# Patient Record
Sex: Female | Born: 1945
Health system: Southern US, Community
[De-identification: ages and names within clinical notes are randomized; demographics above are authoritative.]

---

## 2007-11-26 ENCOUNTER — Ambulatory Visit (HOSPITAL_BASED_OUTPATIENT_CLINIC_OR_DEPARTMENT_OTHER): Admission: RE | Admit: 2007-11-26 | Discharge: 2007-11-26 | Payer: Self-pay | Admitting: Orthopedic Surgery

## 2008-02-04 ENCOUNTER — Emergency Department (HOSPITAL_COMMUNITY): Admission: EM | Admit: 2008-02-04 | Discharge: 2008-02-04 | Payer: Self-pay | Admitting: Emergency Medicine

## 2008-05-27 ENCOUNTER — Ambulatory Visit: Payer: Self-pay | Admitting: Vascular Surgery

## 2008-06-19 ENCOUNTER — Ambulatory Visit: Payer: Self-pay | Admitting: Vascular Surgery

## 2010-08-10 ENCOUNTER — Encounter (INDEPENDENT_AMBULATORY_CARE_PROVIDER_SITE_OTHER): Payer: Self-pay | Admitting: *Deleted

## 2010-08-10 ENCOUNTER — Encounter: Admission: RE | Admit: 2010-08-10 | Discharge: 2010-08-10 | Payer: Self-pay

## 2010-08-16 ENCOUNTER — Encounter (INDEPENDENT_AMBULATORY_CARE_PROVIDER_SITE_OTHER): Payer: Self-pay | Admitting: *Deleted

## 2010-09-26 ENCOUNTER — Ambulatory Visit: Payer: Self-pay | Admitting: Gastroenterology

## 2010-09-26 DIAGNOSIS — K7689 Other specified diseases of liver: Secondary | ICD-10-CM

## 2010-10-10 ENCOUNTER — Encounter: Payer: Self-pay | Admitting: Gastroenterology

## 2010-11-14 ENCOUNTER — Encounter (INDEPENDENT_AMBULATORY_CARE_PROVIDER_SITE_OTHER): Payer: Self-pay | Admitting: General Surgery

## 2010-11-14 ENCOUNTER — Inpatient Hospital Stay (HOSPITAL_COMMUNITY)
Admission: RE | Admit: 2010-11-14 | Discharge: 2010-11-20 | Payer: Self-pay | Source: Home / Self Care | Attending: General Surgery | Admitting: General Surgery

## 2010-11-14 LAB — COMPREHENSIVE METABOLIC PANEL
ALT: 14 U/L (ref 0–35)
AST: 22 U/L (ref 0–37)
Albumin: 3.8 g/dL (ref 3.5–5.2)
Alkaline Phosphatase: 99 U/L (ref 39–117)
BUN: 11 mg/dL (ref 6–23)
CO2: 25 mEq/L (ref 19–32)
Calcium: 9.4 mg/dL (ref 8.4–10.5)
Chloride: 108 mEq/L (ref 96–112)
Creatinine, Ser: 0.66 mg/dL (ref 0.4–1.2)
GFR calc Af Amer: >60 mL/min (ref >60)
GFR calc non Af Amer: >60 mL/min (ref >60)
Glucose, Bld: 92 mg/dL (ref 70–99)
Potassium: 4.6 mEq/L (ref 3.5–5.1)
Sodium: 140 mEq/L (ref 135–145)
Total Bilirubin: 0.4 mg/dL (ref 0.3–1.2)
Total Protein: 6.7 g/dL (ref 6.0–8.3)

## 2010-11-14 LAB — URINALYSIS, ROUTINE W REFLEX MICROSCOPIC
Bilirubin Urine: NEGATIVE
Hgb urine dipstick: NEGATIVE
Ketones, ur: NEGATIVE mg/dL
Nitrite: NEGATIVE
Protein, ur: NEGATIVE mg/dL
Specific Gravity, Urine: 1.023 (ref 1.005–1.030)
Urine Glucose, Fasting: NEGATIVE mg/dL
Urobilinogen, UA: 0.2 mg/dL (ref 0.0–1.0)
pH: 5.5 (ref 5.0–8.0)

## 2010-11-14 LAB — DIFFERENTIAL
Basophils Absolute: 0 10*3/uL (ref 0.0–0.1)
Basophils Relative: 0 % (ref 0–1)
Eosinophils Absolute: 0.2 10*3/uL (ref 0.0–0.7)
Eosinophils Relative: 2 % (ref 0–5)
Lymphocytes Relative: 22 % (ref 12–46)
Lymphs Abs: 3.1 10*3/uL (ref 0.7–4.0)
Monocytes Absolute: 0.9 10*3/uL (ref 0.1–1.0)
Monocytes Relative: 7 % (ref 3–12)
Neutro Abs: 9.6 10*3/uL — ABNORMAL HIGH (ref 1.7–7.7)
Neutrophils Relative %: 69 % (ref 43–77)

## 2010-11-14 LAB — SURGICAL PCR SCREEN
MRSA, PCR: NEGATIVE
Staphylococcus aureus: NEGATIVE

## 2010-11-14 LAB — CBC
HCT: 41.9 % (ref 36.0–46.0)
Hemoglobin: 14 g/dL (ref 12.0–15.0)
MCH: 31.2 pg (ref 26.0–34.0)
MCHC: 33.4 g/dL (ref 30.0–36.0)
MCV: 93.3 fL (ref 78.0–100.0)
Platelets: 341 10*3/uL (ref 150–400)
RBC: 4.49 MIL/uL (ref 3.87–5.11)
RDW: 12.9 % (ref 11.5–15.5)
WBC: 13.8 10*3/uL — ABNORMAL HIGH (ref 4.0–10.5)

## 2010-11-14 LAB — APTT: aPTT: 31 seconds (ref 24–37)

## 2010-11-14 LAB — PROTIME-INR
INR: 0.97 (ref 0.00–1.49)
Prothrombin Time: 13.1 seconds (ref 11.6–15.2)

## 2010-11-14 LAB — ABO/RH: ABO/RH(D): O POS

## 2010-11-16 LAB — POCT I-STAT 3, ART BLOOD GAS (G3+)
Acid-Base Excess: 2 mmol/L (ref 0.0–2.0)
Acid-Base Excess: 4 mmol/L — ABNORMAL HIGH (ref 0.0–2.0)
Acid-Base Excess: 4 mmol/L — ABNORMAL HIGH (ref 0.0–2.0)
Bicarbonate: 27.2 mEq/L — ABNORMAL HIGH (ref 20.0–24.0)
Bicarbonate: 29.4 mEq/L — ABNORMAL HIGH (ref 20.0–24.0)
Bicarbonate: 29.9 mEq/L — ABNORMAL HIGH (ref 20.0–24.0)
Bicarbonate: 30.5 mEq/L — ABNORMAL HIGH (ref 20.0–24.0)
O2 Saturation: 91 %
O2 Saturation: 98 %
O2 Saturation: 95 %
O2 Saturation: 88 %
Patient temperature: 97.8
Patient temperature: 97.8
Patient temperature: 98.4
Patient temperature: 99
TCO2: 29 mmol/L (ref 0–100)
TCO2: 31 mmol/L (ref 0–100)
TCO2: 31 mmol/L (ref 0–100)
TCO2: 32 mmol/L (ref 0–100)
pCO2 arterial: 52.7 mmHg — ABNORMAL HIGH (ref 35.0–45.0)
pCO2 arterial: 54.6 mmHg — ABNORMAL HIGH (ref 35.0–45.0)
pCO2 arterial: 47.5 mmHg — ABNORMAL HIGH (ref 35.0–45.0)
pCO2 arterial: 52.8 mmHg — ABNORMAL HIGH (ref 35.0–45.0)
pH, Arterial: 7.318 — ABNORMAL LOW (ref 7.350–7.400)
pH, Arterial: 7.336 — ABNORMAL LOW (ref 7.350–7.400)
pH, Arterial: 7.406 — ABNORMAL HIGH (ref 7.350–7.400)
pH, Arterial: 7.371 (ref 7.350–7.400)
pO2, Arterial: 65 mmHg — ABNORMAL LOW (ref 80.0–100.0)
pO2, Arterial: 106 mmHg — ABNORMAL HIGH (ref 80.0–100.0)
pO2, Arterial: 74 mmHg — ABNORMAL LOW (ref 80.0–100.0)
pO2, Arterial: 58 mmHg — ABNORMAL LOW (ref 80.0–100.0)

## 2010-11-16 LAB — CBC
HCT: 43.1 % (ref 36.0–46.0)
HCT: 41.8 % (ref 36.0–46.0)
HCT: 41.3 % (ref 36.0–46.0)
HCT: 40.8 % (ref 36.0–46.0)
Hemoglobin: 14.8 g/dL (ref 12.0–15.0)
Hemoglobin: 14.1 g/dL (ref 12.0–15.0)
Hemoglobin: 13.8 g/dL (ref 12.0–15.0)
Hemoglobin: 13.6 g/dL (ref 12.0–15.0)
MCH: 30.1 pg (ref 26.0–34.0)
MCH: 29.4 pg (ref 26.0–34.0)
MCH: 29.1 pg (ref 26.0–34.0)
MCH: 29.9 pg (ref 26.0–34.0)
MCHC: 34.3 g/dL (ref 30.0–36.0)
MCHC: 33.7 g/dL (ref 30.0–36.0)
MCHC: 33.4 g/dL (ref 30.0–36.0)
MCHC: 33.3 g/dL (ref 30.0–36.0)
MCV: 87.6 fL (ref 78.0–100.0)
MCV: 87.1 fL (ref 78.0–100.0)
MCV: 87.1 fL (ref 78.0–100.0)
MCV: 89.7 fL (ref 78.0–100.0)
Platelets: 59 10*3/uL — ABNORMAL LOW (ref 150–400)
Platelets: 65 10*3/uL — ABNORMAL LOW (ref 150–400)
Platelets: 107 10*3/uL — ABNORMAL LOW (ref 150–400)
Platelets: 100 10*3/uL — ABNORMAL LOW (ref 150–400)
RBC: 4.92 MIL/uL (ref 3.87–5.11)
RBC: 4.8 MIL/uL (ref 3.87–5.11)
RBC: 4.74 MIL/uL (ref 3.87–5.11)
RBC: 4.55 MIL/uL (ref 3.87–5.11)
RDW: 15.1 % (ref 11.5–15.5)
RDW: 15.1 % (ref 11.5–15.5)
RDW: 15.2 % (ref 11.5–15.5)
RDW: 15.5 % (ref 11.5–15.5)
WBC: 17.5 10*3/uL — ABNORMAL HIGH (ref 4.0–10.5)
WBC: 11.9 10*3/uL — ABNORMAL HIGH (ref 4.0–10.5)
WBC: 12.7 10*3/uL — ABNORMAL HIGH (ref 4.0–10.5)
WBC: 19.4 10*3/uL — ABNORMAL HIGH (ref 4.0–10.5)

## 2010-11-16 LAB — PREPARE FRESH FROZEN PLASMA
Unit division: 0
Unit division: 0
Unit division: 0
Unit division: 0

## 2010-11-16 LAB — GLUCOSE, CAPILLARY
Glucose-Capillary: 135 mg/dL — ABNORMAL HIGH (ref 70–99)
Glucose-Capillary: 145 mg/dL — ABNORMAL HIGH (ref 70–99)
Glucose-Capillary: 114 mg/dL — ABNORMAL HIGH (ref 70–99)
Glucose-Capillary: 118 mg/dL — ABNORMAL HIGH (ref 70–99)
Glucose-Capillary: 126 mg/dL — ABNORMAL HIGH (ref 70–99)
Glucose-Capillary: 126 mg/dL — ABNORMAL HIGH (ref 70–99)
Glucose-Capillary: 129 mg/dL — ABNORMAL HIGH (ref 70–99)
Glucose-Capillary: 138 mg/dL — ABNORMAL HIGH (ref 70–99)

## 2010-11-16 LAB — APTT
aPTT: 30 seconds (ref 24–37)
aPTT: 28 seconds (ref 24–37)

## 2010-11-16 LAB — BASIC METABOLIC PANEL
BUN: 10 mg/dL (ref 6–23)
CO2: 27 mEq/L (ref 19–32)
Calcium: 8 mg/dL — ABNORMAL LOW (ref 8.4–10.5)
Chloride: 106 mEq/L (ref 96–112)
Creatinine, Ser: 0.81 mg/dL (ref 0.4–1.2)
GFR calc Af Amer: >60 mL/min (ref >60)
GFR calc non Af Amer: >60 mL/min (ref >60)
Glucose, Bld: 230 mg/dL — ABNORMAL HIGH (ref 70–99)
Potassium: 3.8 mEq/L (ref 3.5–5.1)
Sodium: 141 mEq/L (ref 135–145)

## 2010-11-16 LAB — COMPREHENSIVE METABOLIC PANEL
ALT: 334 U/L — ABNORMAL HIGH (ref 0–35)
AST: 329 U/L — ABNORMAL HIGH (ref 0–37)
Albumin: 2.2 g/dL — ABNORMAL LOW (ref 3.5–5.2)
Alkaline Phosphatase: 42 U/L (ref 39–117)
BUN: 6 mg/dL (ref 6–23)
CO2: 29 mEq/L (ref 19–32)
Calcium: 7.9 mg/dL — ABNORMAL LOW (ref 8.4–10.5)
Chloride: 106 mEq/L (ref 96–112)
Creatinine, Ser: 0.69 mg/dL (ref 0.4–1.2)
GFR calc Af Amer: >60 mL/min (ref >60)
GFR calc non Af Amer: >60 mL/min (ref >60)
Glucose, Bld: 145 mg/dL — ABNORMAL HIGH (ref 70–99)
Potassium: 4.1 mEq/L (ref 3.5–5.1)
Sodium: 140 mEq/L (ref 135–145)
Total Bilirubin: 0.7 mg/dL (ref 0.3–1.2)
Total Protein: 4 g/dL — ABNORMAL LOW (ref 6.0–8.3)

## 2010-11-16 LAB — DIC (DISSEMINATED INTRAVASCULAR COAGULATION)PANEL
D-Dimer, Quant: <0.22 ug/mL-FEU (ref 0.00–0.48)
Fibrinogen: 160 mg/dL — ABNORMAL LOW (ref 204–475)
Platelets: 53 10*3/uL — ABNORMAL LOW (ref 150–400)
Prothrombin Time: 21.3 seconds — ABNORMAL HIGH (ref 11.6–15.2)
Smear Review: NONE SEEN
aPTT: 47 seconds — ABNORMAL HIGH (ref 24–37)

## 2010-11-16 LAB — PROTIME-INR
INR: 1.41 (ref 0.00–1.49)
INR: 1.26 (ref 0.00–1.49)
INR: 1.19 (ref 0.00–1.49)
INR: 1.14 (ref 0.00–1.49)
Prothrombin Time: 17.5 seconds — ABNORMAL HIGH (ref 11.6–15.2)
Prothrombin Time: 16 seconds — ABNORMAL HIGH (ref 11.6–15.2)
Prothrombin Time: 15.3 seconds — ABNORMAL HIGH (ref 11.6–15.2)
Prothrombin Time: 14.8 seconds (ref 11.6–15.2)

## 2010-11-16 LAB — PHOSPHORUS: Phosphorus: 4.6 mg/dL (ref 2.3–4.6)

## 2010-11-16 LAB — PREPARE PLATELETS: Unit division: 0

## 2010-11-16 LAB — MAGNESIUM: Magnesium: 1.2 mg/dL — ABNORMAL LOW (ref 1.5–2.5)

## 2010-11-16 LAB — HEMOGLOBIN AND HEMATOCRIT, BLOOD
HCT: 20.7 % — ABNORMAL LOW (ref 36.0–46.0)
Hemoglobin: 6.8 g/dL — CL (ref 12.0–15.0)

## 2010-11-16 LAB — CARDIAC PANEL(CRET KIN+CKTOT+MB+TROPI)
CK, MB: 4.2 ng/mL — ABNORMAL HIGH (ref 0.3–4.0)
Relative Index: 2.7 — ABNORMAL HIGH (ref 0.0–2.5)
Total CK: 158 U/L (ref 7–177)
Troponin I: 0.06 ng/mL (ref 0.00–0.06)

## 2010-11-16 LAB — DIC (DISSEMINATED INTRAVASCULAR COAGULATION) PANEL: INR: 1.83 — ABNORMAL HIGH (ref 0.00–1.49)

## 2010-11-16 LAB — FIBRINOGEN: Fibrinogen: 232 mg/dL (ref 204–475)

## 2010-11-21 LAB — CBC
HCT: 40.7 % (ref 36.0–46.0)
Hemoglobin: 12.9 g/dL (ref 12.0–15.0)
MCH: 29.4 pg (ref 26.0–34.0)
MCHC: 31.7 g/dL (ref 30.0–36.0)
MCV: 92.7 fL (ref 78.0–100.0)
Platelets: 105 10*3/uL — ABNORMAL LOW (ref 150–400)
Platelets: 116 10*3/uL — ABNORMAL LOW (ref 150–400)
RBC: 4.39 MIL/uL (ref 3.87–5.11)
RBC: 4.08 MIL/uL (ref 3.87–5.11)
RDW: 15.5 % (ref 11.5–15.5)
WBC: 17.6 10*3/uL — ABNORMAL HIGH (ref 4.0–10.5)
WBC: 15.5 10*3/uL — ABNORMAL HIGH (ref 4.0–10.5)

## 2010-11-21 LAB — TYPE AND SCREEN
ABO/RH(D): O POS
Antibody Screen: NEGATIVE
Unit division: 0
Unit division: 0
Unit division: 0
Unit division: 0
Unit division: 0
Unit division: 0
Unit division: 0
Unit division: 0
Unit division: 0
Unit division: 0
Unit division: 0
Unit division: 0
Unit division: 0
Unit division: 0
Unit division: 0
Unit division: 0
Unit division: 0
Unit division: 0
Unit division: 0
Unit division: 0
Unit division: 0
Unit division: 0
Unit division: 0
Unit division: 0
Unit division: 0
Unit division: 0
Unit division: 0
Unit division: 0

## 2010-11-21 LAB — GLUCOSE, CAPILLARY
Glucose-Capillary: 120 mg/dL — ABNORMAL HIGH (ref 70–99)
Glucose-Capillary: 114 mg/dL — ABNORMAL HIGH (ref 70–99)
Glucose-Capillary: 120 mg/dL — ABNORMAL HIGH (ref 70–99)
Glucose-Capillary: 110 mg/dL — ABNORMAL HIGH (ref 70–99)

## 2010-11-21 LAB — POCT I-STAT 7, (LYTES, BLD GAS, ICA,H+H)
Bicarbonate: 17.7 mEq/L — ABNORMAL LOW (ref 20.0–24.0)
Calcium, Ion: 0.58 mmol/L — CL (ref 1.12–1.32)
Hemoglobin: 5.8 g/dL — CL (ref 12.0–15.0)
Hemoglobin: 8.5 g/dL — ABNORMAL LOW (ref 12.0–15.0)
O2 Saturation: 100 %
Patient temperature: 35.4
Potassium: 4.1 mEq/L (ref 3.5–5.1)
Sodium: 140 mEq/L (ref 135–145)
TCO2: 19 mmol/L (ref 0–100)
TCO2: 33 mmol/L (ref 0–100)
pCO2 arterial: 48.4 mmHg — ABNORMAL HIGH (ref 35.0–45.0)
pH, Arterial: 7.147 — CL (ref 7.350–7.400)
pH, Arterial: 7.414 — ABNORMAL HIGH (ref 7.350–7.400)

## 2010-11-21 LAB — COMPREHENSIVE METABOLIC PANEL
ALT: 231 U/L — ABNORMAL HIGH (ref 0–35)
AST: 154 U/L — ABNORMAL HIGH (ref 0–37)
Albumin: 2.3 g/dL — ABNORMAL LOW (ref 3.5–5.2)
Alkaline Phosphatase: 50 U/L (ref 39–117)
BUN: 6 mg/dL (ref 6–23)
CO2: 33 mEq/L — ABNORMAL HIGH (ref 19–32)
Calcium: 7.6 mg/dL — ABNORMAL LOW (ref 8.4–10.5)
Chloride: 102 mEq/L (ref 96–112)
Creatinine, Ser: 0.67 mg/dL (ref 0.4–1.2)
GFR calc Af Amer: >60 mL/min (ref >60)
GFR calc non Af Amer: >60 mL/min (ref >60)
Glucose, Bld: 120 mg/dL — ABNORMAL HIGH (ref 70–99)
Potassium: 3.8 mEq/L (ref 3.5–5.1)
Sodium: 140 mEq/L (ref 135–145)
Total Bilirubin: 0.5 mg/dL (ref 0.3–1.2)
Total Protein: 4.6 g/dL — ABNORMAL LOW (ref 6.0–8.3)

## 2010-11-21 LAB — BODY FLUID CULTURE
Culture: NO GROWTH
Gram Stain: NONE SEEN

## 2010-11-21 LAB — APTT: aPTT: 30 seconds (ref 24–37)

## 2010-11-21 LAB — PROTIME-INR
INR: 1.1 (ref 0.00–1.49)
Prothrombin Time: 14.4 seconds (ref 11.6–15.2)

## 2010-11-21 LAB — BASIC METABOLIC PANEL
Chloride: 99 mEq/L (ref 96–112)
GFR calc Af Amer: >60 mL/min (ref >60)
Potassium: 3.7 mEq/L (ref 3.5–5.1)

## 2010-11-21 LAB — MAGNESIUM: Magnesium: 2.1 mg/dL (ref 1.5–2.5)

## 2010-11-22 LAB — CBC
Hemoglobin: 11.7 g/dL — ABNORMAL LOW (ref 12.0–15.0)
MCH: 29.5 pg (ref 26.0–34.0)
MCV: 91.4 fL (ref 78.0–100.0)
RBC: 3.97 MIL/uL (ref 3.87–5.11)

## 2010-11-22 LAB — ANAEROBIC CULTURE: Gram Stain: NONE SEEN

## 2010-11-24 ENCOUNTER — Inpatient Hospital Stay (HOSPITAL_COMMUNITY)
Admission: EM | Admit: 2010-11-24 | Discharge: 2010-11-27 | Payer: Self-pay | Source: Home / Self Care | Attending: Internal Medicine | Admitting: Internal Medicine

## 2010-11-24 LAB — POCT I-STAT, CHEM 8
BUN: 26 mg/dL — ABNORMAL HIGH (ref 6–23)
Creatinine, Ser: 1.2 mg/dL (ref 0.4–1.2)
Sodium: 140 mEq/L (ref 135–145)
TCO2: 21 mmol/L (ref 0–100)

## 2010-11-24 LAB — DIFFERENTIAL
Basophils Relative: 0 % (ref 0–1)
Eosinophils Absolute: 0 10*3/uL (ref 0.0–0.7)
Eosinophils Relative: 0 % (ref 0–5)
Lymphocytes Relative: 12 % (ref 12–46)
Neutro Abs: 13.1 10*3/uL — ABNORMAL HIGH (ref 1.7–7.7)

## 2010-11-24 LAB — URINALYSIS, ROUTINE W REFLEX MICROSCOPIC
Protein, ur: NEGATIVE mg/dL
Urobilinogen, UA: 0.2 mg/dL (ref 0.0–1.0)

## 2010-11-24 LAB — HEPATIC FUNCTION PANEL
ALT: 24 U/L (ref 0–35)
Bilirubin, Direct: 0.1 mg/dL (ref 0.0–0.3)
Indirect Bilirubin: 0.9 mg/dL (ref 0.3–0.9)
Total Bilirubin: 1 mg/dL (ref 0.3–1.2)

## 2010-11-24 LAB — COMPREHENSIVE METABOLIC PANEL
Alkaline Phosphatase: 67 U/L (ref 39–117)
BUN: 23 mg/dL (ref 6–23)
CO2: 22 mEq/L (ref 19–32)
Chloride: 107 mEq/L (ref 96–112)
Creatinine, Ser: 1.22 mg/dL — ABNORMAL HIGH (ref 0.4–1.2)
GFR calc non Af Amer: 45 mL/min — ABNORMAL LOW (ref >60)
Glucose, Bld: 125 mg/dL — ABNORMAL HIGH (ref 70–99)
Total Bilirubin: 0.9 mg/dL (ref 0.3–1.2)

## 2010-11-24 LAB — CBC
HCT: 39.7 % (ref 36.0–46.0)
Hemoglobin: 13.2 g/dL (ref 12.0–15.0)
MCH: 29.8 pg (ref 26.0–34.0)
MCV: 89.6 fL (ref 78.0–100.0)
RBC: 4.43 MIL/uL (ref 3.87–5.11)

## 2010-11-24 LAB — LACTIC ACID, PLASMA: Lactic Acid, Venous: 1.1 mmol/L (ref 0.5–2.2)

## 2010-11-25 LAB — COMPREHENSIVE METABOLIC PANEL
AST: 22 U/L (ref 0–37)
Albumin: 2.7 g/dL — ABNORMAL LOW (ref 3.5–5.2)
BUN: 21 mg/dL (ref 6–23)
Calcium: 8.6 mg/dL (ref 8.4–10.5)
Creatinine, Ser: 0.89 mg/dL (ref 0.4–1.2)
GFR calc Af Amer: >60 mL/min (ref >60)
Total Bilirubin: 0.7 mg/dL (ref 0.3–1.2)
Total Protein: 6.2 g/dL (ref 6.0–8.3)

## 2010-11-25 LAB — CBC
HCT: 42.8 % (ref 36.0–46.0)
Hemoglobin: 14.5 g/dL (ref 12.0–15.0)
MCH: 30.5 pg (ref 26.0–34.0)
MCV: 89.9 fL (ref 78.0–100.0)
RBC: 4.76 MIL/uL (ref 3.87–5.11)

## 2010-11-25 LAB — OVA AND PARASITE EXAMINATION

## 2010-11-25 LAB — CLOSTRIDIUM DIFFICILE BY PCR: Toxigenic C. Difficile by PCR: NEGATIVE

## 2010-11-26 LAB — COMPREHENSIVE METABOLIC PANEL
ALT: 18 U/L (ref 0–35)
Albumin: 2.5 g/dL — ABNORMAL LOW (ref 3.5–5.2)
Alkaline Phosphatase: 55 U/L (ref 39–117)
Potassium: 3.9 mEq/L (ref 3.5–5.1)
Sodium: 142 mEq/L (ref 135–145)
Total Protein: 5.6 g/dL — ABNORMAL LOW (ref 6.0–8.3)

## 2010-11-26 LAB — CBC
HCT: 39.7 % (ref 36.0–46.0)
Platelets: 488 10*3/uL — ABNORMAL HIGH (ref 150–400)
RBC: 4.35 MIL/uL (ref 3.87–5.11)
RDW: 14.3 % (ref 11.5–15.5)
WBC: 14.5 10*3/uL — ABNORMAL HIGH (ref 4.0–10.5)

## 2010-11-26 LAB — DIFFERENTIAL
Eosinophils Absolute: 0.1 10*3/uL (ref 0.0–0.7)
Eosinophils Relative: 1 % (ref 0–5)
Lymphocytes Relative: 20 % (ref 12–46)
Monocytes Absolute: 0.7 10*3/uL (ref 0.1–1.0)
Neutrophils Relative %: 74 % (ref 43–77)

## 2010-11-27 LAB — CBC
HCT: 39.8 % (ref 36.0–46.0)
MCV: 90.5 fL (ref 78.0–100.0)
Platelets: 490 10*3/uL — ABNORMAL HIGH (ref 150–400)
RBC: 4.4 MIL/uL (ref 3.87–5.11)
WBC: 12.8 10*3/uL — ABNORMAL HIGH (ref 4.0–10.5)

## 2010-11-27 LAB — DIFFERENTIAL
Eosinophils Absolute: 0.1 10*3/uL (ref 0.0–0.7)
Lymphocytes Relative: 21 % (ref 12–46)
Lymphs Abs: 2.7 10*3/uL (ref 0.7–4.0)
Neutrophils Relative %: 72 % (ref 43–77)

## 2010-11-27 LAB — BASIC METABOLIC PANEL
BUN: 10 mg/dL (ref 6–23)
Chloride: 110 mEq/L (ref 96–112)
Potassium: 4 mEq/L (ref 3.5–5.1)

## 2010-11-27 LAB — MAGNESIUM: Magnesium: 1.7 mg/dL (ref 1.5–2.5)

## 2010-11-27 LAB — PHOSPHORUS: Phosphorus: 3.5 mg/dL (ref 2.3–4.6)

## 2010-11-27 NOTE — Discharge Summary (Signed)
NAME:  Wendy Rios, Wendy Rios NO.:  [id number]  MEDICAL RECORD NO.:  [id number]          PATIENT TYPE:  INP  LOCATION:  1523                         FACILITY:  [facility]  PHYSICIAN:  Rock Nephew, MD       DATE OF BIRTH:  [birth year]  DATE OF ADMISSION:  11/24/2010 DATE OF DISCHARGE:  11/27/2010                        DISCHARGE SUMMARY - REFERRING   PRIMARY CARE PHYSICIAN:  Dr. Katharina Caper in [place] with Nurse Practitioner, Prescilla Sours.  DISCHARGE DIAGNOSES: 1. Diarrhea, etiology not clear, improved, responded to antibiotics. 2. Acute renal failure, resolved secondary to prerenal. 3. Mild hypokalemia, resolved. 4. Recent drainage of liver cyst with hemorrhage. 5. Rash, etiology is not clear, thought to be diarrhea or exotoxin     related, although Clostridium difficile PCR negative. 6. Left lung base opacity.  Should repeat chest x-ray in 4 to 5 weeks.  DISCHARGE MEDICATIONS:  As follows: 1. Atarax 50 mg every 6 hours as needed.  Do not take with Benadryl. 2. Ativan 1 mg by mouth every 8 hours as needed for anxiety. 3. Ciprofloxacin 500 mg p.o. b.i.d. for 5 days. 4. Benadryl 50 mg every 6 hours as needed.  Do not use Atarax. 5. Metronidazole 500 mg by mouth every 8 hours. 6. Protonix 40 mg p.o. daily for 30 days. 7. Florastor 1000 mg by mouth twice daily. 8. Aspirin 81 mg p.o. daily. 9. Benicar 40 mg p.o. daily. 10.Multivitamins 1 tablet by mouth daily. 11.Phenergan 25 mg 1 tablet by mouth every 6 hours as needed for     nausea. 12.Super B complex plus over-the-counter 1 tablet by mouth daily. 13.Vitamin E 1 capsule by mouth daily. 14.Zoloft 100 mg 1 tablet by mouth daily. 15.Zyrtec 10 mg 1 tablet by mouth daily as needed.  DISPOSITION:  The patient is discharged home.  DIET:  Regular.  PROCEDURES PERFORMED:  The patient had acute abdominal series, last one on November 24, 2010, which showed small area infiltrate in the left lung base laterally,  benign-appearing abdomen.  CONSULTATIONS ON THIS CASE:  [facility], Dr. Donell Beers, and Dr. Ovidio Kin.  DIET:  Regular.  FOLLOWUP:  The patient will follow up with Dr. Katharina Caper and nurse practitioner, Prescilla Sours in [place] within 1 week.  The patient should also follow up with Dr. Almond Lint in 2 weeks.  Also, the patient should repeat a chest x-ray to make sure the opacity has cleared in about 4 to 5 weeks.  If the rash does not go away, the patient should see a dermatologist.  INITIAL HISTORY AND PHYSICAL:  CHIEF COMPLAINT:  Diarrhea.  A 65 year old female who underwent an exploratory laparoscopy of a large liver cyst on November 14, 2010.  She developed hemoperitoneum and went back to surgery for suture.  Apparent bleeding in the vessel from the liver.  She stayed in the ICU for a day and received 10 units of blood. The patient went home on November 20, 2010.  She was having multiple episodes of watery diarrhea and he was admitted to the hospital for diarrhea, dehydration, and acute renal failure.  HOSPITAL COURSE: 1. Diarrhea.  The patient was having  multiple episodes of diarrhea.     Diarrhea improved significantly on November 26, 2010.  The patient's     Clostridium difficile PCR was negative.  The patient's stool for     ova and parasites were negative.  The patient's blood cultures were     negative x2.  The patient's stool culture was negative.  Diarrhea     has improved.  However, the patient has improved with empiric     treatment of antibiotics, ciprofloxacin and metronidazole.  The     patient will be continued on 10-day course of these home     medications. 2. Acute renal failure.  The patient had some mild acute renal     failure/renal insufficiency during the admission.  The patient's     BUN was 26 and creatinine was 1.2.  The patient's BUN and     creatinine are 10 and 0.81 on the day of possible discharge,     November 27, 2010.  The  patient has received IV fluid. 3. Hypokalemia.  The patient had some mild hypokalemia with potassium     of 3.4.  This is most likely related to the diarrhea.  The     potassium is 4 on the day of discharge. 4. Recent drainage of liver cyst followed by hemorrhage, receiving 10     units in the ICU.  The patient seems stable from this point of     view.  The patient can follow up with Dr. Donell Beers in about 2 weeks. 5. Rash.  The patient had diffuse body rash in the front and back,     which has been improving.  The etiology is not clear.  It was     thought to be a exotoxin related rash possibly from the diarrhea,     but it is not clear.  The rash has been improving.  The patient is     having pruritus.  She has been using Benadryl and Atarax.  The     patient will be given Benadryl and Atarax on discharge.  If the     rash does not clear, the patient was advised to follow up with a     dermatologist. 6. Left lung base opacity.  The patient on the one-view chest x-ray of     the acute abdominal series, was discovered to have a left lung base     opacity.  The patient is not having breathing problems.  The     patient is a smoker.  The     patient should repeat a chest x-ray in 4 to 5 weeks to make sure     that the opacity has cleared. 7. Deep venous thrombosis prophylaxis.  The patient was receiving     SCDs.  However, she refused to wear the SCDs later during the     hospital course.     Rock Nephew, MD     NH/MEDQ  D:  11/27/2010  T:  11/27/2010  Job:  [id number]  cc:   Konrad Felix [address] [place] [address]  Prescilla Sours.  Sandria Bales. Ezzard Standing, M.D. [address] [place] [address]  Almond Lint, MD [address] [place] [address]  Electronically Signed by Rock Nephew MD on 11/27/2010 04:42:58 PM

## 2010-11-28 LAB — STOOL CULTURE

## 2010-11-29 NOTE — Assessment & Plan Note (Signed)
History of Present Illness Visit Type: Initial Consult Primary GI MD: Rob Bunting MD Primary Provider: Lasandra Beech, PA Requesting Provider: Robb Matar, MD Chief Complaint: Liver, cystic History of Present Illness:     very pleasant 65 year old womanwho underwent an [place] recently (unclear reason why).  this suggested a 10 cm cyst in her liver and was followed by MRI. MRI showed an 8.8 cm peripheral anterior right hepatic lobe cyst. There worse 2-3 small septae. There was some mild enhancement about the periphery of the lesion. Compared to ultrasound done in 2006 this was clearly larger.  She was told she had a cyst in her liver many years ago.  In 2006 an US showed a 5.5cm liver cyst.  No specific liver disease in the family.  she does have intermittent epigastric and right upper quadrant pains. She says sometimes this can be severe.  There has been thought that perhaps her gallbladder was causing these symptoms. Her gallbladder on ultrasound was normal.             Current Medications (verified): 1)  Align  Caps (Probiotic Product) .Marland Kitchen.. 1 By Mouth Once Daily 2)  Benicar 40 Mg Tabs (Olmesartan Medoxomil) .Marland Kitchen.. 1 By Mouth Once Daily 3)  Premarin 0.3 Mg Tabs (Estrogens Conjugated) .Marland Kitchen.. 1 By Mouth Once Daily 4)  Zoloft 100 Mg Tabs (Sertraline Hcl) .Marland Kitchen.. 1 By Mouth Once Daily 5)  Vitamin E 400 Unit Caps (Vitamin E) .Marland Kitchen.. 1 By Mouth Once Daily 6)  Super B Complex  Tabs (B Complex-C) .... Once Daily 7)  Daily Combo Multivits/calcium  Tabs (Multiple Vitamins-Calcium) .Marland Kitchen.. 1 By Mouth Once Daily 8)  Crestor 20 Mg Tabs (Rosuvastatin Calcium) .Marland Kitchen.. 1 By Mouth Once Daily 9)  Oscal 500/200 D-3 500-200 Mg-Unit Tabs (Calcium Carbonate-Vitamin D) .... Once Daily 10)  Hydrochlorothiazide 12.5 Mg Caps (Hydrochlorothiazide) .... Once Daily  Allergies (verified): 1)  ! Keflex 2)  ! Sulfa 3)  ! * Silver Nitrate 4)  ! Adhesive Tape  Past History:  Past Medical History: Anxiety  Disorder Asthma Chronic Headaches Hyperlipidemia Hypertension dyshidrosis epidermal inclusion cyst  hypokalemia  postmenopausal atrophic vaginitis    Past Surgical History: Appendectomy Hysterectomy Knee Replacement    Family History: no liver disease  Social History: she is married, she has 4 children, she is a retired Runner, broadcasting/film/video, she currently smokes cigarettes, she does not drink alcohol, she drinks 2 caffeinated beverages per day.  Review of Systems       Pertinent positive and negative review of systems were noted in the above HPI and GI specific review of systems.  All other review of systems was otherwise negative.   Vital Signs:  Patient profile:   65 year old female Height:      64 inches Weight:      168.50 pounds BMI:     29.03 Pulse rate:   100 / minute Pulse rhythm:   regular BP sitting:   90 / 50  (left arm) Cuff size:   regular  Vitals Entered By: June McMurray CMA Duncan Dull) (September 26, 2010 10:55 AM)  Physical Exam  Additional Exam:  Constitutional: generally well appearing Psychiatric: alert and oriented times 3 Eyes: extraocular movements intact Mouth: oropharynx moist, no lesions Neck: supple, no lymphadenopathy Cardiovascular: heart regular rate and rythm Lungs: CTA bilaterally Abdomen: soft, mildly tender in the epigastrium, right upper quadrant non-distended, no obvious ascites, no peritoneal signs, normal bowel sounds; I cannot definitively palpate her liver. Extremities: no lower extremity edema bilaterally Skin:  no lesions on visible extremities    Impression & Recommendations:  Problem # 1:  large liver cyst this cyst has clearly grown in size. It was 5.5 cm in 2006 and now measures 8.8 cm by MRI. There is some mild enhancement of the periphery of the lesion. It may also be causing some of her epigastric, right upper quadrant symptoms. I think she should be considered for unroofing of the cyst. I'm going to arrange for surgical evaluation  with Dr. Donell Beers.  Patient Instructions: 1)  We will refer you to Dr. Almond Lint at [facility] to consider "unroofing" the large, probably symptomatic liver cyst that has clearly grown since 2006 ultrasound. 2)  A copy of this information will be sent to Dr. Maisie Fus. 3)  The medication list was reviewed and reconciled.  All changed / newly prescribed medications were explained.  A complete medication list was provided to the patient / caregiver.  Appended Document: Orders Update/CCS    Clinical Lists Changes  Problems: Added new problem of HEPATIC CYST (ICD-573.8) Orders: Added new Test order of [facility] ([facility]) - Signed

## 2010-11-29 NOTE — Letter (Signed)
Summary: New Patient letter  [facility]  [address] [place], [place] [address]   Phone: [phone]  Fax: [phone]       08/16/2010 MRN: [id number]  [facility] [address] [place], [place]  [address]  Dear Ms. Neale Burly,  Welcome to the Gastroenterology Division at [facility].    You are scheduled to see Dr.  Christella Hartigan on 09-26-10 at 11:00a.m. on the 3rd floor at [facility], [address].  We ask that you try to arrive at our office 15 minutes prior to your appointment time to allow for check-in.  We would like you to complete the enclosed self-administered evaluation form prior to your visit and bring it with you on the day of your appointment.  We will review it with you.  Also, please bring a complete list of all your medications or, if you prefer, bring the medication bottles and we will list them.  Please bring your insurance card so that we may make a copy of it.  If your insurance requires a referral to see a specialist, please bring your referral form from your primary care physician.  Co-payments are due at the time of your visit and may be paid by cash, check or credit card.     Your office visit will consist of a consult with your physician (includes a physical exam), any laboratory testing he/she may order, scheduling of any necessary diagnostic testing (e.g. x-ray, ultrasound, CT-scan), and scheduling of a procedure (e.g. Endoscopy, Colonoscopy) if required.  Please allow enough time on your schedule to allow for any/all of these possibilities.    If you cannot keep your appointment, please call [phone] to cancel or reschedule prior to your appointment date.  This allows [place] the opportunity to schedule an appointment for another patient in need of care.  If you do not cancel or reschedule by 5 p.m. the business day prior to your appointment date, you will be charged a $50.00 late cancellation/no-show fee.    Thank you for  choosing [facility] for your medical needs.  We appreciate the opportunity to care for you.  Please visit [place] at our website  to learn more about our practice.                     Sincerely,                                                             The Gastroenterology Division

## 2010-12-01 LAB — CULTURE, BLOOD (ROUTINE X 2)
Culture  Setup Time: 201201272113
Culture: NO GROWTH

## 2010-12-01 NOTE — Letter (Signed)
Summary: [facility]   Imported By: Lester  10/27/2010 12:01:31  _____________________________________________________________________  External Attachment:    Type:   Image     Comment:   External Document

## 2010-12-08 NOTE — H&P (Signed)
NAME:  Wendy Rios, Wendy Rios            ACCOUNT NO.:  [id number]  MEDICAL RECORD NO.:  [id number]          PATIENT TYPE:  INP  LOCATION:  0101                         FACILITY:  [facility]  PHYSICIAN:  Calvert Cantor, M.D.     DATE OF BIRTH:  24-Nov-1945  DATE OF ADMISSION:  11/24/2010 DATE OF DISCHARGE:                             HISTORY & PHYSICAL   PRIMARY CARE PHYSICIAN:  Dr. Maisie Fus in [place], consequently she is unassigned.  CHIEF COMPLAINT:  Diarrhea.  HISTORY OF PRESENT ILLNESS:  This is a 65 year old female who underwent exploratory laparoscopy for a large liver cyst on November 14, 2010.  She developed hemoperitoneum and went back in the surgery for suture repair of a bleeding vessel in the liver.  She stayed in ICU for a day and received 10 units of blood.  After discharge at home this past Sunday, she had her first stool in several days that was formed and then she developed diarrhea.  She is now having 10 plus watery stools daily.  The diarrhea even wakes her from sleep.  She denies any blood in her stools. She had 1 episode of vomiting after she ate a few peaches last night. The patient has also developed a rash under her arms, her thighs, and her knees.  She has received Phenergan and Kenalog at her primary care physician's office yesterday.  The patient has a history of pseudomembranous colitis.  She normally has a loose bowel movements after meals.  PAST MEDICAL HISTORY: 1. Hypertension. 2. Hyperlipidemia. 3. Anxiety. 4. Ankle surgery.  HOME MEDICATIONS: 1. Zyrtec 10 mg 1 tablet daily as needed. 2. Aspirin 81 mg 1 tablet daily. 3. Vitamin E OTC 1 caplet daily. 4. Super B complex plus C over-the-counter 1 tablet daily. 5. Multivitamins OTC 1 tablet daily. 6. Premarin 0.3 mg 1 tablet daily. 7. Zoloft 100 mg 1 tablet daily. 8. Benicar 40 mg 1 tablet daily.  ALLERGIES:  SULFA, CODEINE, SILVER NITRATE, and CEPHALOSPORINS.  REVIEW OF SYSTEMS:  GENERAL:  The patient  denies any fever, night sweats or insomnia.  Her appetite is decreased.  HEAD:  She denies any headache pain in her ears, eyes, nose or throat.  CHEST:  She denies difficulty breathing, cough, or hemoptysis.  CARDIOVASCULAR: She denies chest pain, palpitations, orthopnea, PND or lower extremity weakness.  RESPIRATORY: The patient denies cough, shortness of breath, or hemoptysis. GASTROINTESTINAL:  Please see HPI as the patient is here for diarrhea and she has had 1 episode of vomiting.  She is having abdominal pain at her surgical site, this is non expected.  EXTREMITIES:  She is not complaining of any acute pain or swelling in her extremities.  No decreased range of motion.  SKIN:  She denies any rashes, bruises, or lesions.  NEUROLOGIC:  She denies any syncope or seizures.  PSYCHIATRIC: She denies any depression or suicidal ideations.  SOCIAL HISTORY:  She just quit smoking when she was admitted to the hospital about a week and a half ago.  No alcohol.  No drugs.  She has 4 children and one of her daughters is an operating room nurse here in the [facility].  Her husband is a Teacher, early years/pre.  FAMILY HISTORY:  Significant for her mother who had an MI and died of cirrhosis.  Her father died of natural causes.  PHYSICAL EXAMINATION:  GENERAL:  This is a well-developed, well- nourished Caucasian female who appears ill. VITAL SIGNS:  Temperature 98.2, pulse 89, respirations 16, blood pressure 139/42. HEENT:  Head is atraumatic, normocephalic.  Eyes are anicteric with pupils are equal, round, reactive to light.  Nose shows no nasal discharge or exterior lesions. MOUTH:  Moist mucous membranes with good dentition. NECK:  Supple with midline trachea.  No JVD.  No lymphadenopathy. CHEST:  Mild wheezes and minimal coarse breath sounds but no more than normal work of breathing. ABDOMEN:  Distended.  Her incision has staples in it looks good.  No signs of drainage or erythema.  She has good bowel  sounds.  Her abdomen is tender to palpation.  While I am with her, she jumps up and runs to the bathroom where she perceives to have a very watery bowel movement of diarrhea. EXTREMITIES:  No clubbing, cyanosis, or edema.  She is able to move all 4 extremities without difficulty. SKIN:  She has areas of raised redness on her neck, chest, and later parts of breast, her inner thighs and knees bilaterally.  She reports that this is very itchy and we can see where she has been scratching. NEUROLOGIC:  Cranial nerves II through XII appear to be grossly intact. She has no facial asymmetries, no obvious focal neuro deficits. PSYCHIATRIC:  Patient is alert and oriented, but fatigued and tired. Her demeanor is appropriate, cooperative.  Her grooming is good.  LABORATORY DATA:  Pertinent for hemoglobin of 14.3, hematocrit 42.0, white count 16.1, platelets of 468, BUN 26, creatinine 1.2 it is noteworthy that on November 17, 2010, her creatinine was 0.62.  Sodium 140, potassium 3.4, glucose 126, serum albumin 2.7.  Acute abdominal series is pending.  ASSESSMENT:  Dr. Calvert Cantor has seen and examined the patient, collected history, reviewed her chart and spoken at length with the PA, the husband, the daughter and the patient about the case.  IMPRESSION:  This is a 65 year old female with diarrhea starting Sunday having 10 bowel movements a day including at night.  She has been incontinent few times.  She denies abdominal pain.  She has vomited once since yesterday.  No fever. 1. Diarrhea - colitis, likely C difficile.  We will order C difficile     and PCR and stool cultures.  We will also start IV antibiotics     after her stool collection and provide symptomatic care with Zofran     and probiotics as well as IV fluids with potassium. 2. Acute renal failure.  We will provide her with IV fluids and     monitor this with edema in the morning. 3. Mild hypokalemia.  We will replete her potassium  in her IV fluids. 4. Drainage of liver cyst followed by severe hemorrhage and     transfusion of 10 units of packed red blood cells     January 16. 5. Rash.  The patient received Kenalog yesterday and will have     Benadryl p.r.n. here in the  hospital.  Patient is a full code.     Stephani Police, PA   ______________________________ Calvert Cantor, M.D.    MLY/MEDQ  D:  11/24/2010  T:  11/24/2010  Job:  [id number]  Electronically Signed by Algis Downs PA on 11/30/2010 04:47:57  PM Electronically Signed by Calvert Cantor M.D. on 12/08/2010 12:33:45 PM

## 2011-02-15 NOTE — Discharge Summary (Signed)
  NAMEBERNEICE, ZETTLEMOYER            ACCOUNT NO.:  [id number]  MEDICAL RECORD NO.:  [id number]          PATIENT TYPE:  OIB  LOCATION:  5150                         FACILITY:  [facility]  PHYSICIAN:  Almond Lint, MD       DATE OF BIRTH:  06-20-1946  DATE OF ADMISSION:  11/14/2010 DATE OF DISCHARGE:  11/20/2010                              DISCHARGE SUMMARY   FINAL DIAGNOSIS:  Complex cyst of the liver, benign biliary cystadenoma, final pathology.  SECONDARY DIAGNOSES: 1. Postoperative hemorrhage. 2. Depression. 3. Hypertension. 4 . Hyperlipidemia.  PRINCIPAL PROCEDURE:  Laparoscopic liver cyst unroofing on November 14, 2010 and takeback for exploratory laparotomy with suture repair of bleeding vessel and liver on November 14, 2010.  PRINCIPAL LABORATORY DATA:  Postoperatively, she had a hematocrit of 17 and required take back to the operating room.  She got significant amount of blood transfusion and prior to discharge her hematocrit was 36.3.  Her coagulation studies were within normal limits preoperatively and her INR went into 1.83 during the time of hemorrhage or was back down to 1.19 prior to discharge.  She had elevated blood glucoses between 112 and 230.  DISCHARGE CONDITION:  Improved and stable.  DISCHARGE INSTRUCTIONS:  No heavy lifting or strenuous activity for 6 weeks.  May shower.  Regular diet.  HOSPITAL COURSE:  Ms. Baumert was admitted to the hospital following her take back exploratory laparotomy for bleeding.  She was sent to the ICU and extubated.  She did well in the postop period and required a short period of Neo-Synephrine.  This was weaned down to half and she was extubated on postop day #1.  She did well with return of bowel function but did have a significant depression.  She had an ileus for several days and required significant pulmonary toilet.  She was transferred to the floor and her IV fluids were KVO'ed and she has tolerated Foley removal with  spontaneous void.  She required a suppository to help have a bowel movements.  She was restarted back on her Zoloft which began to help her mood.  She was discharged to home in stable condition.     Almond Lint, MD     FB/MEDQ  D:  02/14/2011  T:  02/14/2011  Job:  [id number]  Electronically Signed by Almond Lint MD on 02/15/2011 10:48:37 AM

## 2011-03-14 NOTE — Procedures (Signed)
LOWER EXTREMITY ARTERIAL EVALUATION-SINGLE LEVEL   INDICATION:  Bilateral lower extremity claudication.  The patient  complains of bilateral claudication at 1/2-2 blocks, left > right for  approximately 1 year.  The patient denies rest pain bilaterally.   HISTORY:  Diabetes:  No.  Cardiac:  No.  Hypertension:  Yes.  Smoking:  Yes, one pack per day.  Previous Surgery:  The patient had left ankle surgery in 10/2007.   RESTING SYSTOLIC PRESSURES: (ABI)                          RIGHT                LEFT  Brachial:               136                  136  Anterior tibial:        88                   76  Posterior tibial:       92 (0.68)            80 (0.59)  Peroneal:  DOPPLER WAVEFORM ANALYSIS:  Anterior tibial:        Biphasic             Biphasic  Posterior tibial:       Biphasic             Biphasic  Peroneal:   PREVIOUS ABI'S:  Date:  RIGHT:  LEFT:   IMPRESSION:  1. Bilateral ABIs are consistent with moderate arterial compromise.  2. Preliminary report faxed to Dr. Maurine Minister office at 9:45 a.m. on      05/27/2008.   ___________________________________________  V. Charlena Cross, MD   PB/MEDQ  D:  05/27/2008  T:  05/27/2008  Job:  [id number]

## 2011-03-14 NOTE — Consult Note (Signed)
NEW PATIENT CONSULTATION   Wendy Rios, Wendy Rios  DOB:  04/24/1946                                       06/19/2008  [id number]#:[id number]   Patient presents today for evaluation of bilateral lower extremity  claudication.  I cared for the patient's husband in the past, and he is  here with her for evaluation today.  Patient reports that she has calf  claudication symptoms.  She does not have any resting symptoms.  She  reports that this always occurs in her calves and is slightly worse on  the left than on the right.  She does not have any thigh or buttock  symptoms.  She reports this has been progressive over the past several  years.  She does have a history of elevated blood pressure, elevated  cholesterol.   Her family history is negative for premature atherosclerotic disease.   SOCIAL HISTORY:  She is married with four children.  She is a retired  Programmer, systems.  She does smoke one pack of cigarettes per day and has tried  many times to quit this.  She does not drink alcohol on a regular basis.   REVIEW OF SYSTEMS:  Her weight is reported at 158 pounds.  She is 5 feet  4 inches tall.  She does not have any cardiac, pulmonary, or GI  symptoms.  She does not have any orthopedic issues.   MEDICATION ALLERGIES:  1. SULFA.  2. KEFLEX.  3. CODEINE.   MEDICATIONS:  Premarin, Zoloft, Benicar, Centrum.   She does have a history of hypertension with some difficulty controlling  this.   PHYSICAL EXAMINATION:  A well-developed and well-nourished white female  appearing stated age of 89.  Her blood pressure today is 165/90, pulse  100, respirations 18.  Her carotid arteries are without bruits  bilaterally.  Heart:  Regular rate and rhythm.  Her radial pulses are  2+.  She does have 2+ femoral pulses.  I do not palpate popliteal  pulses.  She does have 1+ dorsalis pedis pulses bilaterally.   I reviewed her recent ankle-arm index arterial study with her.  This  reveals a  left ankle-arm index of 0.59 and on the right of 0.68.   I had a long discussion with the patient and her husband.  I explained  that she does not have any risks for limb-threatening ischemia  currently.  I did explain the critical importance of absolute smoking  cessation to improve her walking ability.  I explained that further  evaluation would require arteriogram but that I would reserve this for  limiting claudication symptoms.  She reports that currently she is able  to tolerate her degree of claudication and will work on smoking  cessation.  I explained that smoking cessation should allow her to  reduce the progression of her atherosclerotic changes and also should  allow her to have better walking ability.  She was reassured with this  discussion and will notify us should she develop any progressive  symptoms.   Larina Earthly, M.D.  Electronically Signed   TFE/MEDQ  D:  06/19/2008  T:  06/19/2008  Job:  [id number]   cc:   Dr. Dyanne Carrel. Maisie Fus

## 2011-03-14 NOTE — Op Note (Signed)
NAME:  Wendy Rios, RIPLEY NO.:  [id number]   MEDICAL RECORD NO.:  [id number]          PATIENT TYPE:  AMB   LOCATION:  [facility]SC                          FACILITY:  [facility]   PHYSICIAN:  Leonides Grills, M.D.     DATE OF BIRTH:  May 07, 1946   DATE OF PROCEDURE:  11/26/2007  DATE OF DISCHARGE:                               OPERATIVE REPORT   PREOPERATIVE DIAGNOSES:  1. Left anterior ankle impingement.  2. Left posteromedial talar dome osteochondral lesion.   POSTOPERATIVE DIAGNOSES:  1. Left anterior ankle impingement.  2. Left posteromedial talar dome osteochondral lesion.   OPERATION:  1. Left ankle arthroscopy with extensive debridement.  2. Left debridement drilling medial talar dome osteochondral lesion.   ANESTHESIA:  General.   SURGEON:  Leonides Grills, MD   ASSISTANT:  Richardean Canal, P.A.   ESTIMATED BLOOD LOSS:  Minimal.   TOURNIQUET:  None.   COMPLICATIONS:  None.   DISPOSITION:  Stable to PR.   INDICATIONS:  This is a 65 year old female who has had longstanding left  ankle pain that is interfering with her life to the point she can not do  what she wants to do.  She was consented for the above procedure.  All  risks which include infection, nerve or vessel injury, sinus formation,  synovial cyst formation, persistent pain, worse pain, prolonged  recovery, stiffness, arthritis and possible revision of this with a  future scope and debridement of the lesion versus mosaicplasty were all  explained and questions were encouraged and answered.   OPERATION:  The patient brought to the operating room, placed in supine  position initially after adequate general endotracheal tube anesthesia  was administered as well as Ancef 1 gram IV piggyback.  The patient was  then placed in a sloppy lateral position with the operative side up on a  beanbag.  All bony prominence well padded.  Left lower extremity is  prepped, draped in sterile manner.  No tourniquet was used.   Anatomical  landmarks to include anterior tibialis tendon, superficial peroneal  nerve were mapped out.  Spinal needle was then placed just medial to  anterior tibialis tendon and 20 mL normal saline instilled into the  ankle.  Weston Brass and spread technique was then utilized to create the  anteromedial portal medial to the anterior tibialis tendon.  Blunt tip  trocar with cannula followed by camera was placed in the ankle, under  direct visualization, anterolateral portal was created with a spinal  needle followed by nick spread technique lateral to the superficial  peroneal nerve.  There was a large amount of synovitis anterolaterally.  The anterior accessory tib-fib ligament was impinging and this was  extensively debrided with a shaver as well as radiofrequency bevel.  We  also visualize the anterolateral gutter and it was without significant  pathology.  The patient was __________ throughout the procedure.  We  then placed camera anterolaterally visualized anteromedially. There was  a large amount of synovitis in this area.  This was then debrided with a  shaver as well as a radiofrequency bevel.  We then probed the cartilage  and there was no flap in this area.  However, probing the cartilage  found that there was a very soft and then cartilage tear on the  posterior medial, medial aspect of the talar dome.  This was then  debrided with a House curette as well as a shaver down to bone and then  multiple drill holes were placed with a microfracture technique.  We  then decreased the inflow and this bled beautifully.  Pictures were  obtained throughout the procedure.  Camera was removed.  Wounds were  closed with 4-0 nylon stitch.  Sterile dressing applied.  Cam walker  boot applied.  The patient was stable to PR.      Leonides Grills, M.D.  Electronically Signed     PB/MEDQ  D:  11/26/2007  T:  11/26/2007  Job:  [id number]

## 2011-07-25 LAB — POCT I-STAT, CHEM 8
Calcium, Ion: 1.16
HCT: 47 — ABNORMAL HIGH
Hemoglobin: 16 — ABNORMAL HIGH
TCO2: 27

## 2011-07-25 LAB — CBC
HCT: 43.8
Hemoglobin: 14.8
MCHC: 33.8
Platelets: 414 — ABNORMAL HIGH
RDW: 12.9

## 2011-07-25 LAB — POCT CARDIAC MARKERS: Operator id: 294521

## 2011-07-25 LAB — DIFFERENTIAL
Basophils Absolute: 0
Basophils Relative: 0
Eosinophils Relative: 1
Monocytes Absolute: 0.5

## 2011-11-17 IMAGING — CR DG CHEST 1V PORT
1 series · 1 of 1 positions shown · non-contrast
Comparison: 11/14/2010.

CLINICAL DATA: History of liver cyst.  Postoperative status.

PORTABLE CHEST - 1 VIEW

[AP]
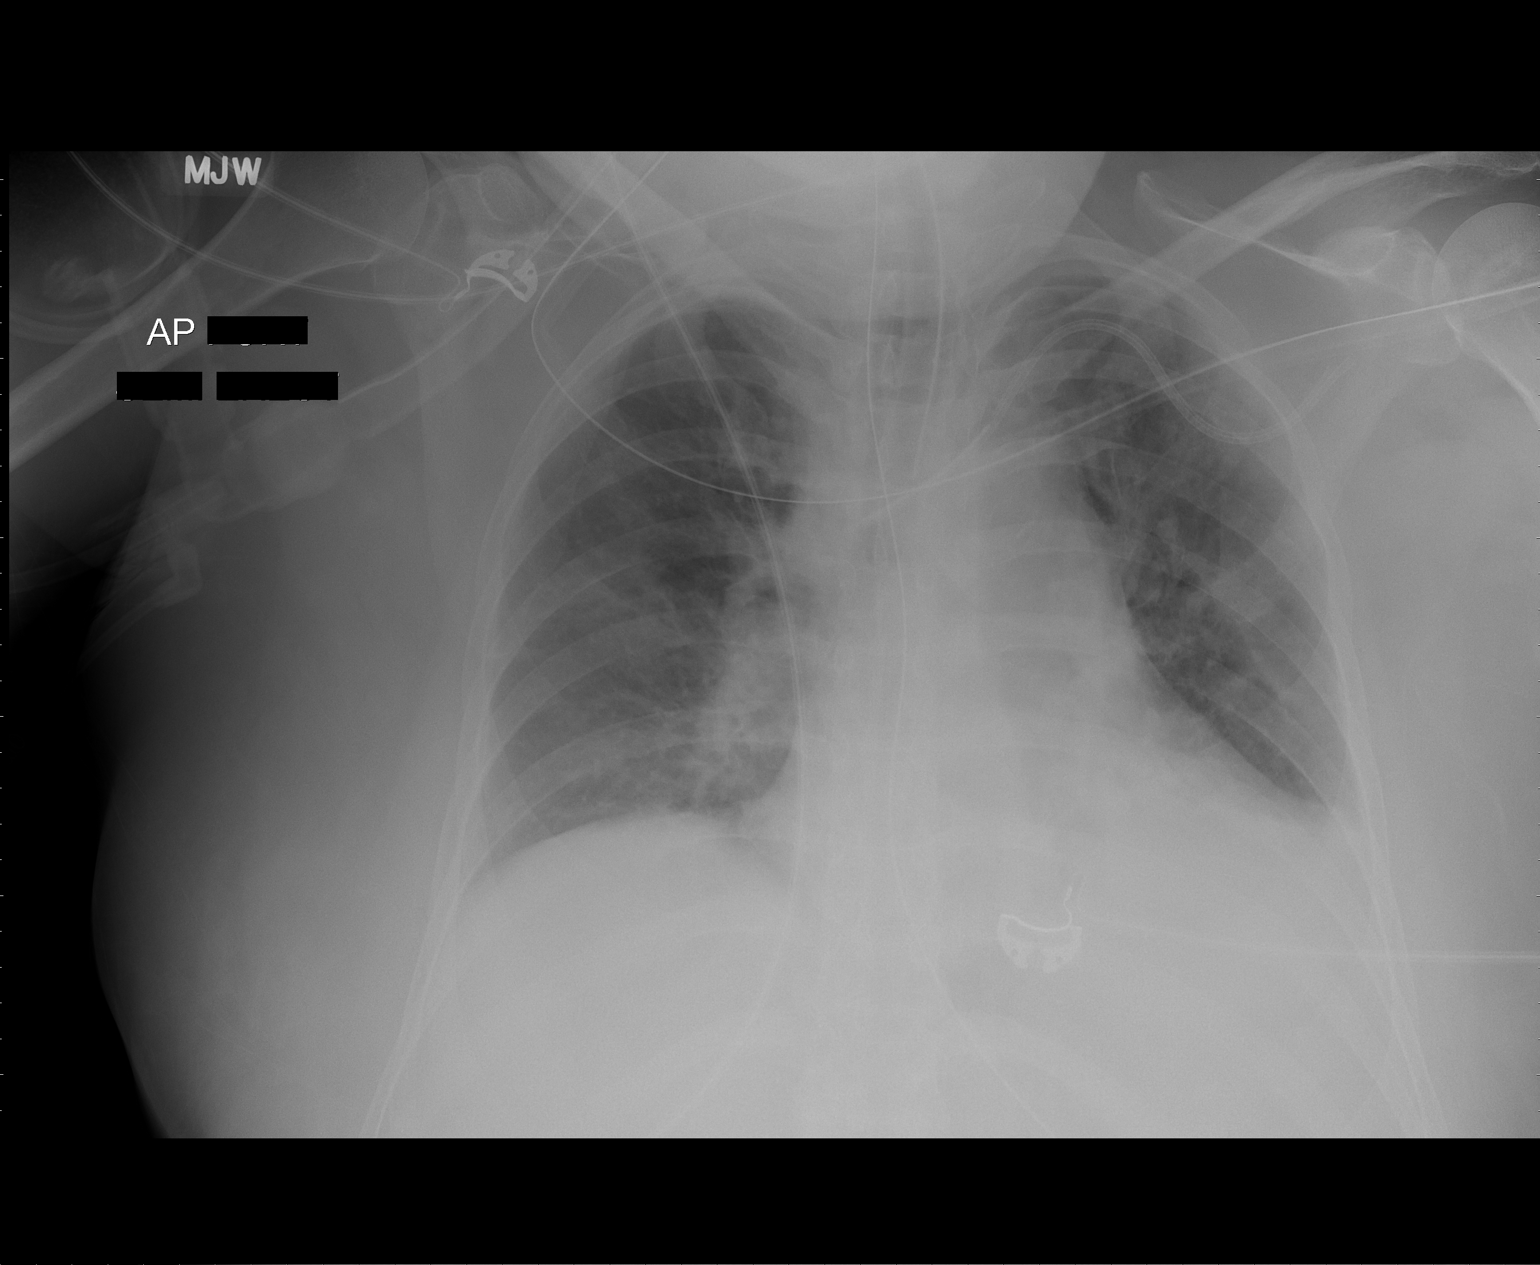

[1 of 1 positions shown; findings below may reference images not displayed]

FINDINGS: Tip of endotracheal tube terminates 2.5 cm above the
carina.  Tip of left brachiocephalic venous catheter terminates in
superior vena cava.  Enteric tube enters area of stomach.  Tip is
not included on the image.  Stable minimal enlargement of the
cardiac silhouette.  No pneumothorax.  Minimal basilar atelectasis
on the right.  Opacity in the inferior left hemithorax consistent
with infiltrate and atelectasis and probable small amount of left
pleural effusion.  Left basilar opacity has increased slightly
since previous study.
IMPRESSION: Support apparatus is described above.  Stable minimal enlargement
of the cardiac silhouette.  Minimal right basilar atelectasis.
Opacity in the inferior left hemithorax consistent with infiltrate
and atelectasis and probable small amount of left pleural effusion.
Left basilar opacity has increased slightly since previous study.

## 2011-11-26 IMAGING — CR DG ABDOMEN ACUTE W/ 1V CHEST
4 series · 4 of 4 positions shown · non-contrast
Comparison: Chest x-ray dated 11/17/2010 and MRI of the abdomen
dated 08/10/2010

CLINICAL DATA: Abdominal pain and diarrhea.  Abdominal surgery for
liver cyst on 11/14/2010.

ACUTE ABDOMEN SERIES (ABDOMEN 2 VIEW & CHEST 1 VIEW)

[w abdomen decub *]
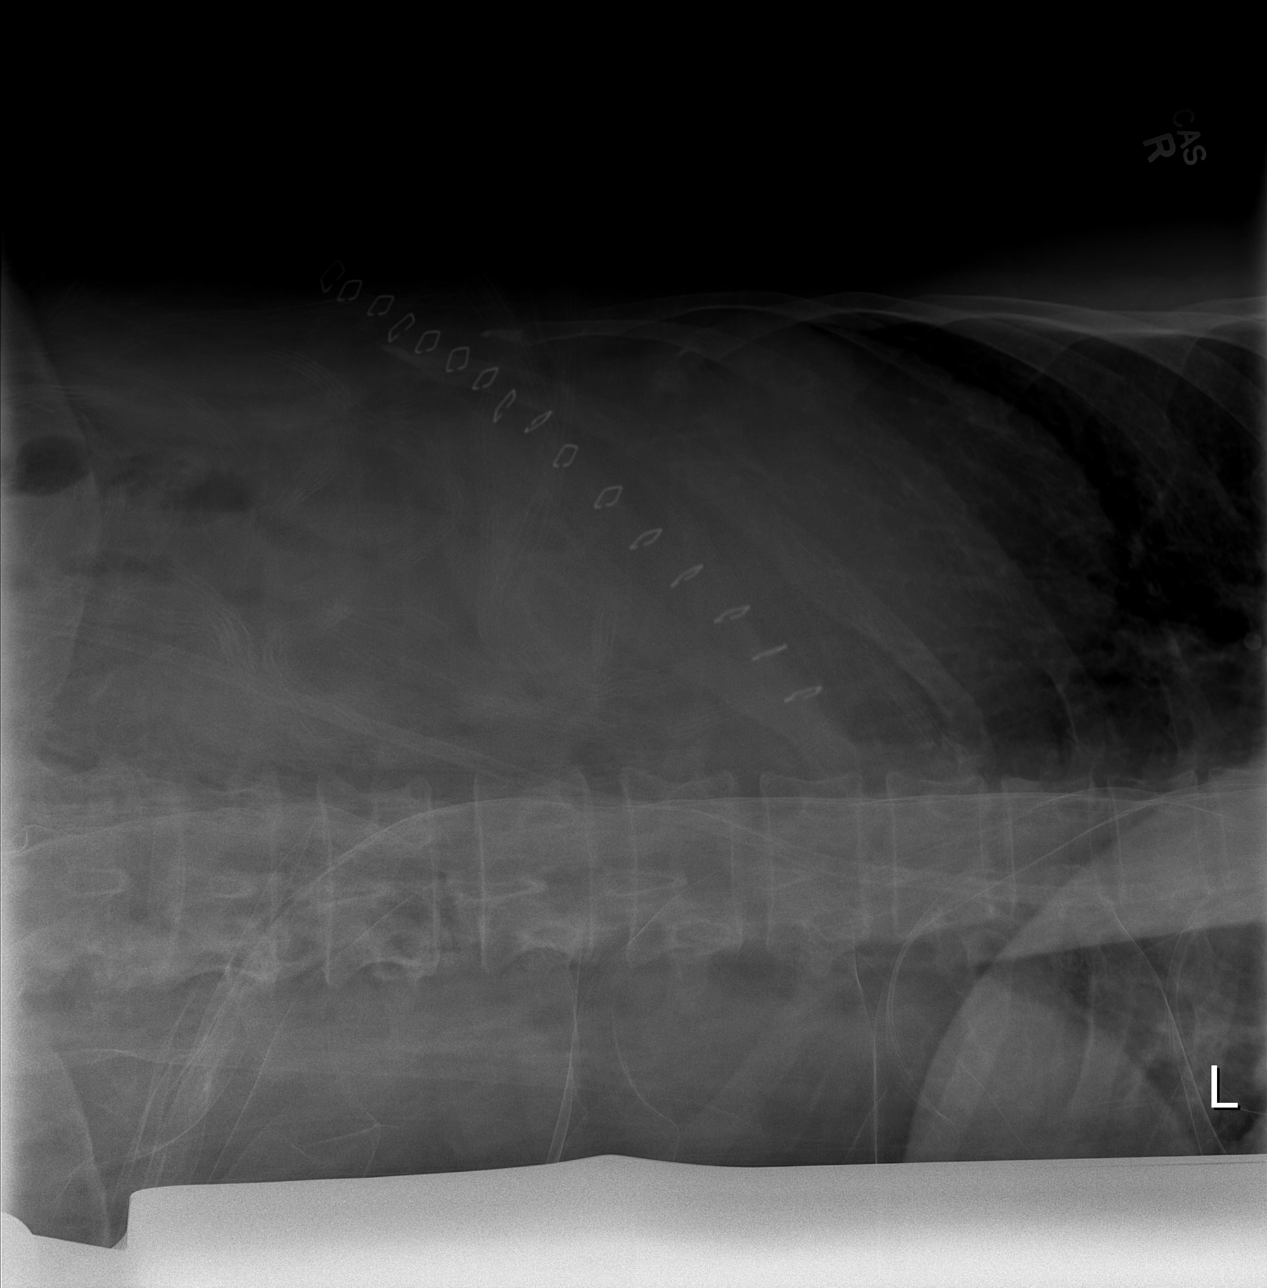

[view not recorded (1 of 3)]
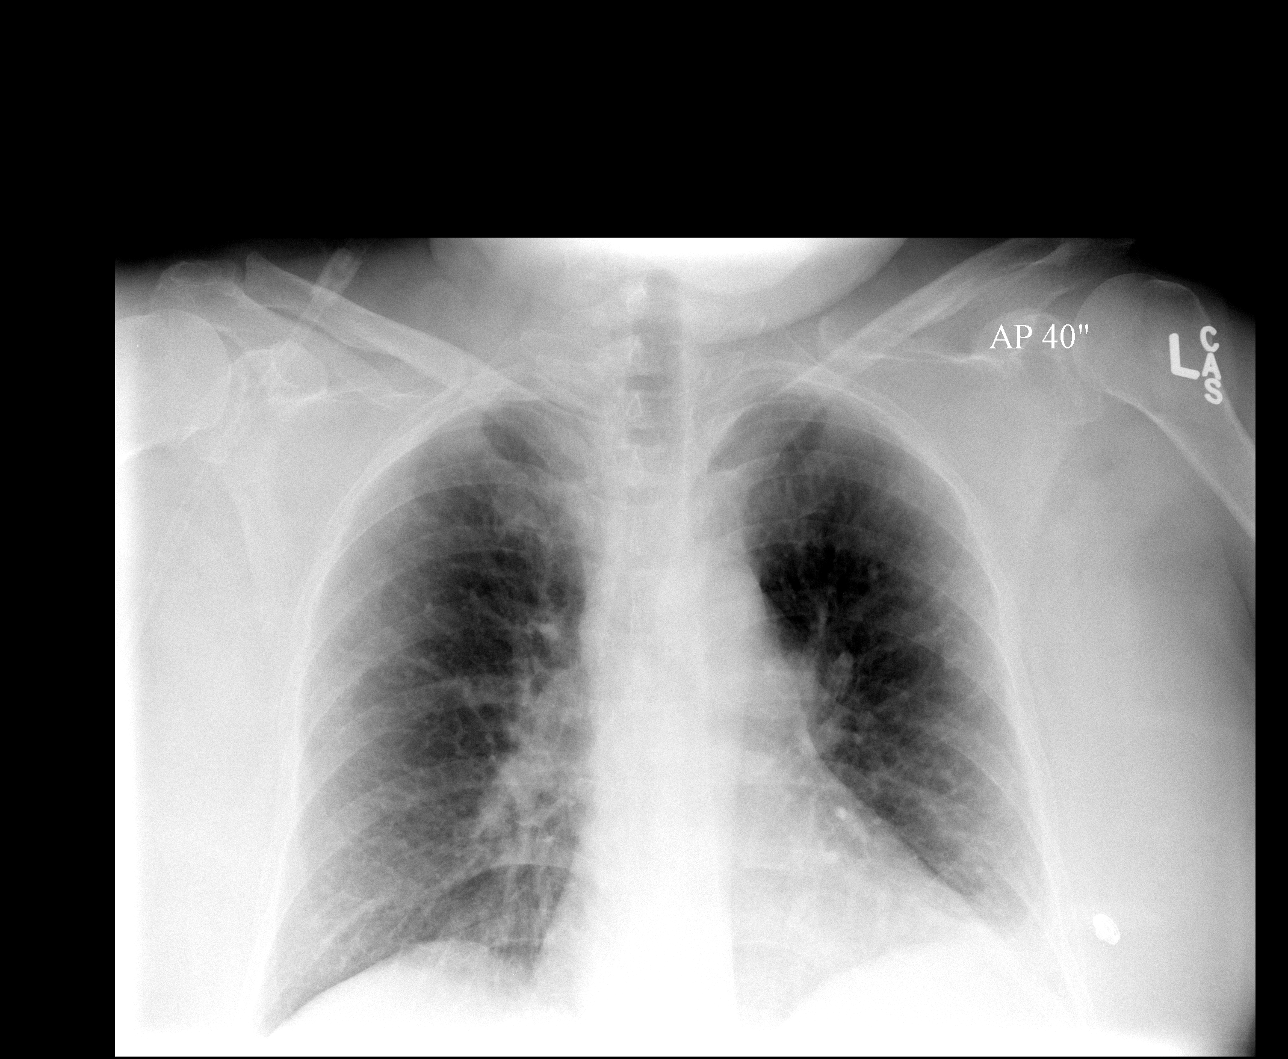

[view not recorded (2 of 3)]
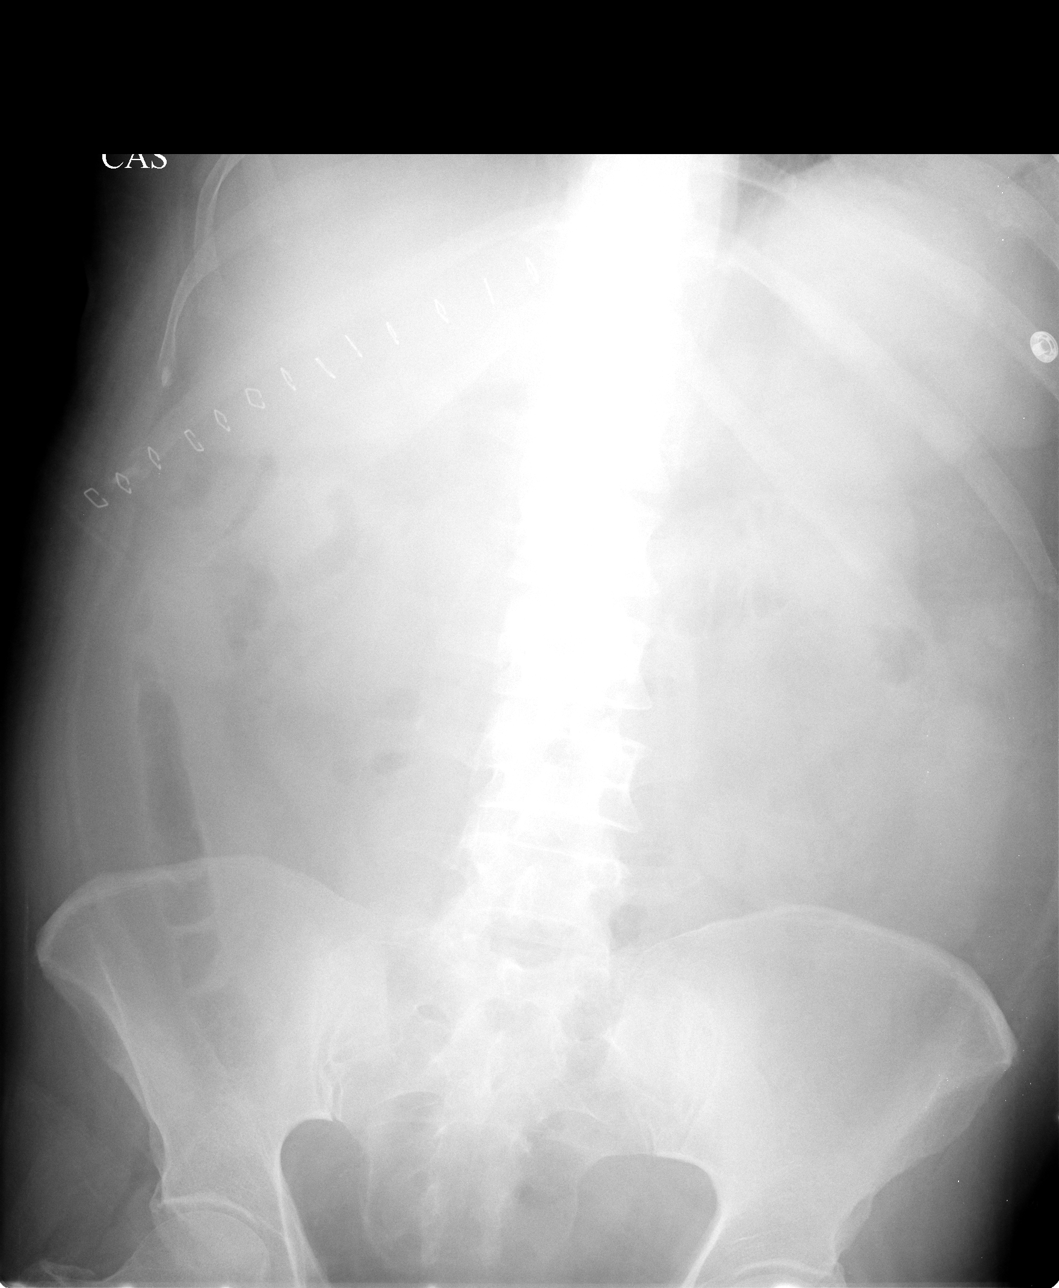

[view not recorded (3 of 3)]
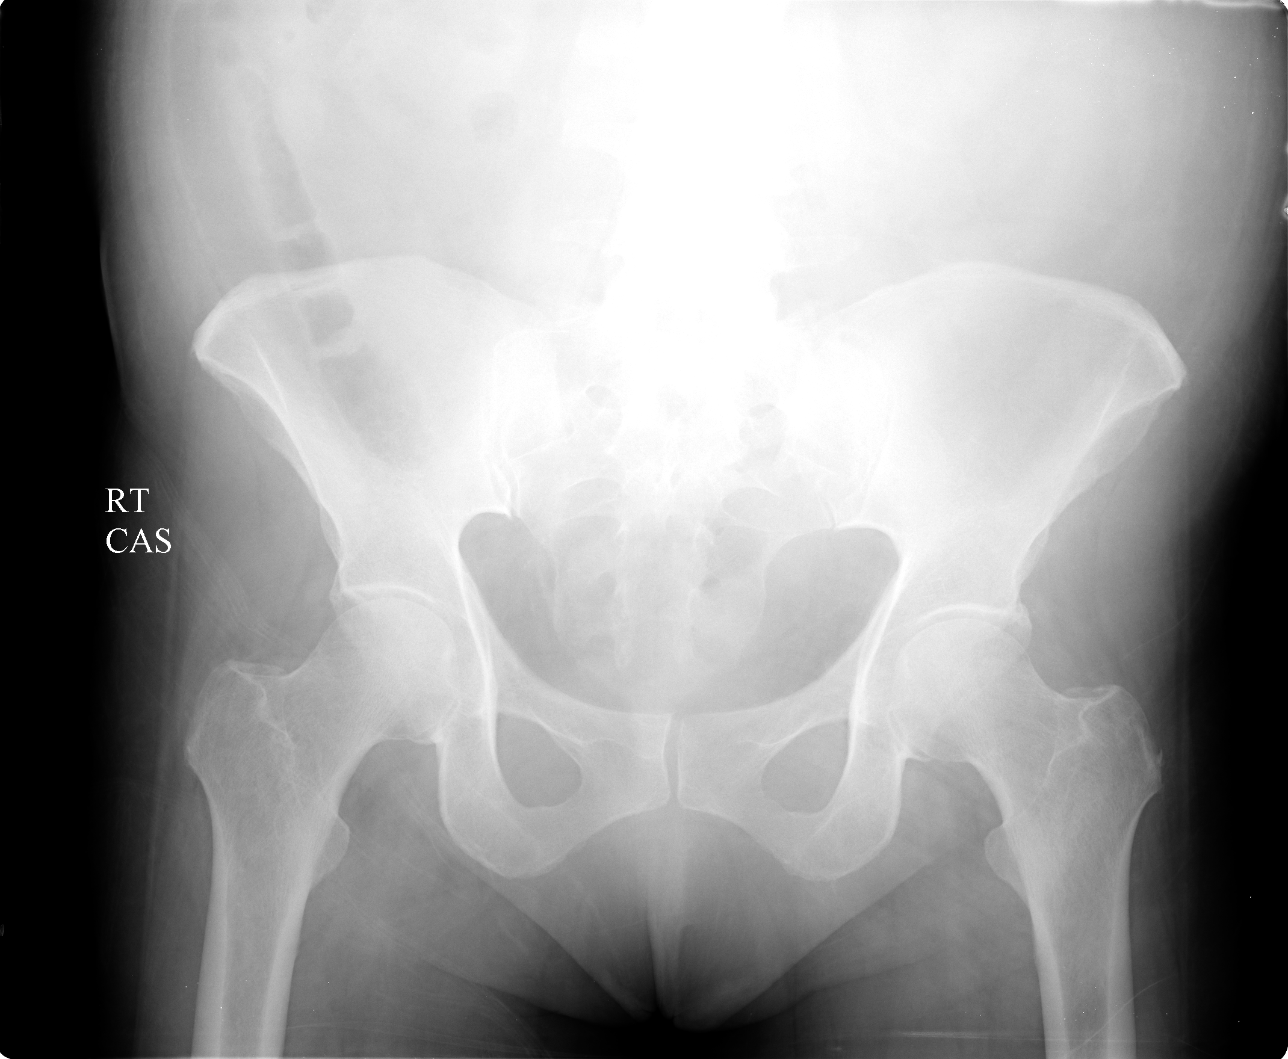

[4 of 4 positions shown; findings below may reference images not displayed]

FINDINGS: There is a small patchy area of infiltrate at the left
lung base laterally, best seen on the abdominal radiograph.  The
lungs are otherwise clear and the heart size and vascularity are
normal.

There is no free air in the abdomen.  There are no dilated loops of
large or small bowel.  There is only a small amount of air
scattered throughout the bowel.
IMPRESSION: 1.  Small area of infiltrate at the left lung base laterally.
2.  Benign-appearing abdomen.

## 2014-02-20 ENCOUNTER — Other Ambulatory Visit: Payer: Self-pay | Admitting: Family Medicine

## 2014-02-20 DIAGNOSIS — Z1231 Encounter for screening mammogram for malignant neoplasm of breast: Secondary | ICD-10-CM

## 2014-02-27 ENCOUNTER — Ambulatory Visit
Admission: RE | Admit: 2014-02-27 | Discharge: 2014-02-27 | Disposition: A | Discharge status: Home / Self Care | Payer: Medicare Other | Source: Ambulatory Visit | Attending: Family Medicine | Admitting: Family Medicine

## 2014-02-27 ENCOUNTER — Encounter (INDEPENDENT_AMBULATORY_CARE_PROVIDER_SITE_OTHER): Payer: Self-pay

## 2014-02-27 DIAGNOSIS — Z1231 Encounter for screening mammogram for malignant neoplasm of breast: Secondary | ICD-10-CM

## 2015-02-22 ENCOUNTER — Other Ambulatory Visit: Payer: Self-pay | Admitting: Family Medicine

## 2015-02-22 DIAGNOSIS — Z1231 Encounter for screening mammogram for malignant neoplasm of breast: Secondary | ICD-10-CM

## 2015-02-22 DIAGNOSIS — M8580 Other specified disorders of bone density and structure, unspecified site: Secondary | ICD-10-CM

## 2015-03-05 ENCOUNTER — Other Ambulatory Visit: Payer: Self-pay

## 2015-03-05 ENCOUNTER — Ambulatory Visit: Payer: Self-pay

## 2015-03-11 ENCOUNTER — Ambulatory Visit
Admission: RE | Admit: 2015-03-11 | Discharge: 2015-03-11 | Disposition: A | Discharge status: Home / Self Care | Payer: Medicare Other | Source: Ambulatory Visit | Attending: Family Medicine | Admitting: Family Medicine

## 2015-03-11 DIAGNOSIS — M8580 Other specified disorders of bone density and structure, unspecified site: Secondary | ICD-10-CM

## 2015-03-11 DIAGNOSIS — Z1231 Encounter for screening mammogram for malignant neoplasm of breast: Secondary | ICD-10-CM

## 2017-03-20 ENCOUNTER — Other Ambulatory Visit: Payer: Self-pay | Admitting: Family Medicine

## 2017-03-20 DIAGNOSIS — Z1231 Encounter for screening mammogram for malignant neoplasm of breast: Secondary | ICD-10-CM

## 2017-04-10 ENCOUNTER — Ambulatory Visit
Admission: RE | Admit: 2017-04-10 | Discharge: 2017-04-10 | Disposition: A | Discharge status: Home / Self Care | Payer: Medicare Other | Source: Ambulatory Visit | Attending: Family Medicine | Admitting: Family Medicine

## 2017-04-10 ENCOUNTER — Encounter: Payer: Self-pay | Admitting: Radiology

## 2017-04-10 DIAGNOSIS — Z1231 Encounter for screening mammogram for malignant neoplasm of breast: Secondary | ICD-10-CM

## 2018-04-12 ENCOUNTER — Other Ambulatory Visit: Payer: Self-pay | Admitting: Family Medicine

## 2018-04-12 DIAGNOSIS — Z1231 Encounter for screening mammogram for malignant neoplasm of breast: Secondary | ICD-10-CM

## 2018-04-12 DIAGNOSIS — D493 Neoplasm of unspecified behavior of breast: Secondary | ICD-10-CM

## 2019-12-07 ENCOUNTER — Ambulatory Visit: Payer: Medicare PPO | Attending: Internal Medicine

## 2019-12-07 DIAGNOSIS — Z23 Encounter for immunization: Secondary | ICD-10-CM

## 2019-12-07 NOTE — Progress Notes (Signed)
   Covid-19 Vaccination Clinic  Name:  Wendy Rios    MRN: [id number] DOB: 03-03-1946  12/07/2019  [name] was observed post Covid-19 immunization for 15 minutes without incidence. She was provided with Vaccine Information Sheet and instruction to access the V-Safe system.   [name] was instructed to call 911 with any severe reactions post vaccine: Marland Kitchen Difficulty breathing  . Swelling of your face and throat  . A fast heartbeat  . A bad rash all over your body  . Dizziness and weakness    Immunizations Administered    Name Date Dose VIS Date Route   Pfizer COVID-19 Vaccine 12/07/2019  3:47 PM 0.3 mL 10/10/2019 Intramuscular   Manufacturer: [place]   Lot: [id number]   [place]: [id number]

## 2020-01-01 ENCOUNTER — Ambulatory Visit: Payer: Medicare PPO | Attending: Internal Medicine

## 2020-01-01 DIAGNOSIS — Z23 Encounter for immunization: Secondary | ICD-10-CM | POA: Insufficient documentation

## 2020-01-01 NOTE — Progress Notes (Signed)
   Covid-19 Vaccination Clinic  Name:  Wendy Rios    MRN: [id number] DOB: 1946-06-08  01/01/2020  [name] was observed post Covid-19 immunization for 15 minutes without incident. She was provided with Vaccine Information Sheet and instruction to access the V-Safe system.   [name] was instructed to call 911 with any severe reactions post vaccine: Marland Kitchen Difficulty breathing  . Swelling of face and throat  . A fast heartbeat  . A bad rash all over body  . Dizziness and weakness   Immunizations Administered    Name Date Dose VIS Date Route   Pfizer COVID-19 Vaccine 01/01/2020 11:57 AM 0.3 mL 10/10/2019 Intramuscular   Manufacturer: [place]   Lot: [id number]   [place]: [id number]

## 2020-01-05 DIAGNOSIS — E039 Hypothyroidism, unspecified: Secondary | ICD-10-CM | POA: Diagnosis not present

## 2020-01-05 DIAGNOSIS — E119 Type 2 diabetes mellitus without complications: Secondary | ICD-10-CM | POA: Diagnosis not present

## 2020-01-05 DIAGNOSIS — E559 Vitamin D deficiency, unspecified: Secondary | ICD-10-CM | POA: Diagnosis not present

## 2020-01-05 DIAGNOSIS — N182 Chronic kidney disease, stage 2 (mild): Secondary | ICD-10-CM | POA: Diagnosis not present

## 2020-01-05 DIAGNOSIS — K2100 Gastro-esophageal reflux disease with esophagitis, without bleeding: Secondary | ICD-10-CM | POA: Diagnosis not present

## 2020-01-05 DIAGNOSIS — I1310 Hypertensive heart and chronic kidney disease without heart failure, with stage 1 through stage 4 chronic kidney disease, or unspecified chronic kidney disease: Secondary | ICD-10-CM | POA: Diagnosis not present

## 2020-01-05 DIAGNOSIS — E785 Hyperlipidemia, unspecified: Secondary | ICD-10-CM | POA: Diagnosis not present

## 2020-01-26 DIAGNOSIS — E039 Hypothyroidism, unspecified: Secondary | ICD-10-CM | POA: Diagnosis not present

## 2020-01-26 DIAGNOSIS — E119 Type 2 diabetes mellitus without complications: Secondary | ICD-10-CM | POA: Diagnosis not present

## 2020-01-26 DIAGNOSIS — I1310 Hypertensive heart and chronic kidney disease without heart failure, with stage 1 through stage 4 chronic kidney disease, or unspecified chronic kidney disease: Secondary | ICD-10-CM | POA: Diagnosis not present

## 2020-01-26 DIAGNOSIS — E785 Hyperlipidemia, unspecified: Secondary | ICD-10-CM | POA: Diagnosis not present

## 2020-01-26 DIAGNOSIS — K2100 Gastro-esophageal reflux disease with esophagitis, without bleeding: Secondary | ICD-10-CM | POA: Diagnosis not present

## 2020-02-12 DIAGNOSIS — Z1331 Encounter for screening for depression: Secondary | ICD-10-CM | POA: Diagnosis not present

## 2020-02-12 DIAGNOSIS — Z1231 Encounter for screening mammogram for malignant neoplasm of breast: Secondary | ICD-10-CM | POA: Diagnosis not present

## 2020-02-12 DIAGNOSIS — Z9181 History of falling: Secondary | ICD-10-CM | POA: Diagnosis not present

## 2020-02-12 DIAGNOSIS — Z0000 Encounter for general adult medical examination without abnormal findings: Secondary | ICD-10-CM | POA: Diagnosis not present

## 2020-02-12 DIAGNOSIS — E785 Hyperlipidemia, unspecified: Secondary | ICD-10-CM | POA: Diagnosis not present

## 2020-02-26 DIAGNOSIS — N189 Chronic kidney disease, unspecified: Secondary | ICD-10-CM | POA: Diagnosis not present

## 2020-05-12 DIAGNOSIS — R739 Hyperglycemia, unspecified: Secondary | ICD-10-CM | POA: Diagnosis not present

## 2020-05-12 DIAGNOSIS — E039 Hypothyroidism, unspecified: Secondary | ICD-10-CM | POA: Diagnosis not present

## 2020-05-12 DIAGNOSIS — E559 Vitamin D deficiency, unspecified: Secondary | ICD-10-CM | POA: Diagnosis not present

## 2020-05-12 DIAGNOSIS — K2100 Gastro-esophageal reflux disease with esophagitis, without bleeding: Secondary | ICD-10-CM | POA: Diagnosis not present

## 2020-05-12 DIAGNOSIS — F410 Panic disorder [episodic paroxysmal anxiety] without agoraphobia: Secondary | ICD-10-CM | POA: Diagnosis not present

## 2020-05-12 DIAGNOSIS — E785 Hyperlipidemia, unspecified: Secondary | ICD-10-CM | POA: Diagnosis not present

## 2020-05-12 DIAGNOSIS — I1310 Hypertensive heart and chronic kidney disease without heart failure, with stage 1 through stage 4 chronic kidney disease, or unspecified chronic kidney disease: Secondary | ICD-10-CM | POA: Diagnosis not present

## 2020-05-12 DIAGNOSIS — Z79899 Other long term (current) drug therapy: Secondary | ICD-10-CM | POA: Diagnosis not present

## 2020-05-12 DIAGNOSIS — N182 Chronic kidney disease, stage 2 (mild): Secondary | ICD-10-CM | POA: Diagnosis not present

## 2020-05-12 DIAGNOSIS — Z6826 Body mass index (BMI) 26.0-26.9, adult: Secondary | ICD-10-CM | POA: Diagnosis not present

## 2020-05-20 DIAGNOSIS — Z1211 Encounter for screening for malignant neoplasm of colon: Secondary | ICD-10-CM | POA: Diagnosis not present

## 2020-05-31 DIAGNOSIS — I1310 Hypertensive heart and chronic kidney disease without heart failure, with stage 1 through stage 4 chronic kidney disease, or unspecified chronic kidney disease: Secondary | ICD-10-CM | POA: Diagnosis not present

## 2020-07-14 DIAGNOSIS — I1310 Hypertensive heart and chronic kidney disease without heart failure, with stage 1 through stage 4 chronic kidney disease, or unspecified chronic kidney disease: Secondary | ICD-10-CM | POA: Diagnosis not present

## 2020-09-03 DIAGNOSIS — Z23 Encounter for immunization: Secondary | ICD-10-CM | POA: Diagnosis not present

## 2020-09-06 ENCOUNTER — Other Ambulatory Visit (HOSPITAL_BASED_OUTPATIENT_CLINIC_OR_DEPARTMENT_OTHER): Payer: Self-pay | Admitting: Internal Medicine

## 2020-09-06 ENCOUNTER — Ambulatory Visit: Payer: Medicare PPO | Attending: Internal Medicine

## 2020-09-06 DIAGNOSIS — Z23 Encounter for immunization: Secondary | ICD-10-CM

## 2020-09-06 NOTE — Progress Notes (Signed)
   Covid-19 Vaccination Clinic  Name:  TALECIA SHERLIN    MRN: [id number] DOB: [birth year]  09/06/2020  Ms. Osburn was observed post Covid-19 immunization for 15 minutes without incident. She was provided with Vaccine Information Sheet and instruction to access the V-Safe system.   Ms. Macwilliams was instructed to call 911 with any severe reactions post vaccine: Marland Kitchen Difficulty breathing  . Swelling of face and throat  . A fast heartbeat  . A bad rash all over body  . Dizziness and weakness

## 2020-09-10 MED FILL — PFIZER-BIONTECH COVID-19 VA: 30 | 1 days supply | Qty: 0 | Fill #0

## 2020-11-17 ENCOUNTER — Other Ambulatory Visit: Payer: Self-pay | Admitting: Family Medicine

## 2020-11-17 DIAGNOSIS — R739 Hyperglycemia, unspecified: Secondary | ICD-10-CM | POA: Diagnosis not present

## 2020-11-17 DIAGNOSIS — E785 Hyperlipidemia, unspecified: Secondary | ICD-10-CM | POA: Diagnosis not present

## 2020-11-17 DIAGNOSIS — Z1231 Encounter for screening mammogram for malignant neoplasm of breast: Secondary | ICD-10-CM

## 2020-11-17 DIAGNOSIS — K2100 Gastro-esophageal reflux disease with esophagitis, without bleeding: Secondary | ICD-10-CM | POA: Diagnosis not present

## 2020-11-17 DIAGNOSIS — E039 Hypothyroidism, unspecified: Secondary | ICD-10-CM | POA: Diagnosis not present

## 2020-11-17 DIAGNOSIS — E538 Deficiency of other specified B group vitamins: Secondary | ICD-10-CM | POA: Diagnosis not present

## 2020-11-17 DIAGNOSIS — Z139 Encounter for screening, unspecified: Secondary | ICD-10-CM | POA: Diagnosis not present

## 2020-11-17 DIAGNOSIS — N182 Chronic kidney disease, stage 2 (mild): Secondary | ICD-10-CM | POA: Diagnosis not present

## 2020-11-17 DIAGNOSIS — E559 Vitamin D deficiency, unspecified: Secondary | ICD-10-CM | POA: Diagnosis not present

## 2020-11-17 DIAGNOSIS — I1310 Hypertensive heart and chronic kidney disease without heart failure, with stage 1 through stage 4 chronic kidney disease, or unspecified chronic kidney disease: Secondary | ICD-10-CM | POA: Diagnosis not present

## 2020-11-17 DIAGNOSIS — F410 Panic disorder [episodic paroxysmal anxiety] without agoraphobia: Secondary | ICD-10-CM | POA: Diagnosis not present

## 2021-01-18 ENCOUNTER — Ambulatory Visit: Payer: Medicare PPO

## 2021-03-21 ENCOUNTER — Ambulatory Visit: Payer: Medicare PPO

## 2021-03-21 NOTE — Progress Notes (Signed)
   Covid-19 Vaccination Clinic  Name:  VIRGIA KELNER    MRN: [id number] DOB: 12/15/1945  03/21/2021  Ms. Drone was observed post Covid-19 immunization for 15 minutes without incident. She was provided with Vaccine Information Sheet and instruction to access the V-Safe system.   Ms. Blane was instructed to call 911 with any severe reactions post vaccine: Marland Kitchen Difficulty breathing  . Swelling of face and throat  . A fast heartbeat  . A bad rash all over body  . Dizziness and weakness

## 2021-03-25 ENCOUNTER — Other Ambulatory Visit (HOSPITAL_BASED_OUTPATIENT_CLINIC_OR_DEPARTMENT_OTHER): Payer: Self-pay

## 2021-03-25 MED ORDER — PFIZER-BIONT COVID-19 VAC-TRIS 30 MCG/0.3ML IM SUSP
INTRAMUSCULAR | 0 refills | Status: AC
  Filled 2021-03-25: qty 0.3, 1d supply, fill #0

## 2021-03-29 DIAGNOSIS — Z9181 History of falling: Secondary | ICD-10-CM | POA: Diagnosis not present

## 2021-03-29 DIAGNOSIS — E785 Hyperlipidemia, unspecified: Secondary | ICD-10-CM | POA: Diagnosis not present

## 2021-03-29 DIAGNOSIS — Z0000 Encounter for general adult medical examination without abnormal findings: Secondary | ICD-10-CM | POA: Diagnosis not present

## 2021-03-29 DIAGNOSIS — Z1331 Encounter for screening for depression: Secondary | ICD-10-CM | POA: Diagnosis not present

## 2021-05-20 DIAGNOSIS — E039 Hypothyroidism, unspecified: Secondary | ICD-10-CM | POA: Diagnosis not present

## 2021-05-20 DIAGNOSIS — K2100 Gastro-esophageal reflux disease with esophagitis, without bleeding: Secondary | ICD-10-CM | POA: Diagnosis not present

## 2021-05-20 DIAGNOSIS — R739 Hyperglycemia, unspecified: Secondary | ICD-10-CM | POA: Diagnosis not present

## 2021-05-20 DIAGNOSIS — E785 Hyperlipidemia, unspecified: Secondary | ICD-10-CM | POA: Diagnosis not present

## 2021-05-20 DIAGNOSIS — N182 Chronic kidney disease, stage 2 (mild): Secondary | ICD-10-CM | POA: Diagnosis not present

## 2021-05-20 DIAGNOSIS — E559 Vitamin D deficiency, unspecified: Secondary | ICD-10-CM | POA: Diagnosis not present

## 2021-05-20 DIAGNOSIS — Z1331 Encounter for screening for depression: Secondary | ICD-10-CM | POA: Diagnosis not present

## 2021-05-20 DIAGNOSIS — Z9181 History of falling: Secondary | ICD-10-CM | POA: Diagnosis not present

## 2021-05-20 DIAGNOSIS — E538 Deficiency of other specified B group vitamins: Secondary | ICD-10-CM | POA: Diagnosis not present

## 2021-05-20 DIAGNOSIS — I1310 Hypertensive heart and chronic kidney disease without heart failure, with stage 1 through stage 4 chronic kidney disease, or unspecified chronic kidney disease: Secondary | ICD-10-CM | POA: Diagnosis not present

## 2021-08-22 DIAGNOSIS — Z23 Encounter for immunization: Secondary | ICD-10-CM | POA: Diagnosis not present

## 2021-09-06 ENCOUNTER — Ambulatory Visit: Payer: Medicare PPO | Attending: Internal Medicine

## 2021-09-06 DIAGNOSIS — Z23 Encounter for immunization: Secondary | ICD-10-CM

## 2021-09-06 NOTE — Progress Notes (Signed)
   Covid-19 Vaccination Clinic  Name:  Wendy Rios    MRN: [id number] DOB: [birth year]  09/06/2021  Wendy Rios was observed post Covid-19 immunization for 15 minutes without incident. She was provided with Vaccine Information Sheet and instruction to access the V-Safe system.   Wendy Rios was instructed to call 911 with any severe reactions post vaccine: Difficulty breathing  Swelling of face and throat  A fast heartbeat  A bad rash all over body  Dizziness and weakness   Immunizations Administered     Name Date Dose VIS Date Route   Pfizer Covid-19 Vaccine Bivalent Booster 09/06/2021 10:54 AM 0.3 mL 06/29/2021 Intramuscular   Manufacturer: [place]   Lot: [id number]   [place]: [phone]

## 2021-09-26 ENCOUNTER — Other Ambulatory Visit (HOSPITAL_BASED_OUTPATIENT_CLINIC_OR_DEPARTMENT_OTHER): Payer: Self-pay

## 2021-09-26 MED ORDER — PFIZER COVID-19 VAC BIVALENT 30 MCG/0.3ML IM SUSP
INTRAMUSCULAR | 0 refills | Status: AC
  Filled 2021-09-26: qty 0.3, 1d supply, fill #0

## 2021-11-07 DIAGNOSIS — H2513 Age-related nuclear cataract, bilateral: Secondary | ICD-10-CM | POA: Diagnosis not present

## 2021-11-07 DIAGNOSIS — H25013 Cortical age-related cataract, bilateral: Secondary | ICD-10-CM | POA: Diagnosis not present

## 2021-11-07 DIAGNOSIS — H524 Presbyopia: Secondary | ICD-10-CM | POA: Diagnosis not present

## 2021-11-07 DIAGNOSIS — B3731 Acute candidiasis of vulva and vagina: Secondary | ICD-10-CM | POA: Diagnosis not present

## 2021-11-07 DIAGNOSIS — L0211 Cutaneous abscess of neck: Secondary | ICD-10-CM | POA: Diagnosis not present

## 2021-11-17 DIAGNOSIS — R739 Hyperglycemia, unspecified: Secondary | ICD-10-CM | POA: Diagnosis not present

## 2021-11-17 DIAGNOSIS — Z9181 History of falling: Secondary | ICD-10-CM | POA: Diagnosis not present

## 2021-11-17 DIAGNOSIS — E785 Hyperlipidemia, unspecified: Secondary | ICD-10-CM | POA: Diagnosis not present

## 2021-11-17 DIAGNOSIS — N182 Chronic kidney disease, stage 2 (mild): Secondary | ICD-10-CM | POA: Diagnosis not present

## 2021-11-17 DIAGNOSIS — R221 Localized swelling, mass and lump, neck: Secondary | ICD-10-CM | POA: Diagnosis not present

## 2021-11-17 DIAGNOSIS — E039 Hypothyroidism, unspecified: Secondary | ICD-10-CM | POA: Diagnosis not present

## 2021-11-17 DIAGNOSIS — E538 Deficiency of other specified B group vitamins: Secondary | ICD-10-CM | POA: Diagnosis not present

## 2021-11-17 DIAGNOSIS — Z1331 Encounter for screening for depression: Secondary | ICD-10-CM | POA: Diagnosis not present

## 2021-11-17 DIAGNOSIS — I1310 Hypertensive heart and chronic kidney disease without heart failure, with stage 1 through stage 4 chronic kidney disease, or unspecified chronic kidney disease: Secondary | ICD-10-CM | POA: Diagnosis not present

## 2021-11-17 DIAGNOSIS — E559 Vitamin D deficiency, unspecified: Secondary | ICD-10-CM | POA: Diagnosis not present

## 2021-11-17 DIAGNOSIS — K2100 Gastro-esophageal reflux disease with esophagitis, without bleeding: Secondary | ICD-10-CM | POA: Diagnosis not present

## 2021-12-15 DIAGNOSIS — I1310 Hypertensive heart and chronic kidney disease without heart failure, with stage 1 through stage 4 chronic kidney disease, or unspecified chronic kidney disease: Secondary | ICD-10-CM | POA: Diagnosis not present

## 2022-05-22 DIAGNOSIS — F410 Panic disorder [episodic paroxysmal anxiety] without agoraphobia: Secondary | ICD-10-CM | POA: Diagnosis not present

## 2022-05-22 DIAGNOSIS — I1310 Hypertensive heart and chronic kidney disease without heart failure, with stage 1 through stage 4 chronic kidney disease, or unspecified chronic kidney disease: Secondary | ICD-10-CM | POA: Diagnosis not present

## 2022-05-22 DIAGNOSIS — R739 Hyperglycemia, unspecified: Secondary | ICD-10-CM | POA: Diagnosis not present

## 2022-05-22 DIAGNOSIS — E039 Hypothyroidism, unspecified: Secondary | ICD-10-CM | POA: Diagnosis not present

## 2022-05-22 DIAGNOSIS — K2100 Gastro-esophageal reflux disease with esophagitis, without bleeding: Secondary | ICD-10-CM | POA: Diagnosis not present

## 2022-05-22 DIAGNOSIS — R519 Headache, unspecified: Secondary | ICD-10-CM | POA: Diagnosis not present

## 2022-05-22 DIAGNOSIS — Z1231 Encounter for screening mammogram for malignant neoplasm of breast: Secondary | ICD-10-CM | POA: Diagnosis not present

## 2022-05-22 DIAGNOSIS — E785 Hyperlipidemia, unspecified: Secondary | ICD-10-CM | POA: Diagnosis not present

## 2022-05-22 DIAGNOSIS — N182 Chronic kidney disease, stage 2 (mild): Secondary | ICD-10-CM | POA: Diagnosis not present

## 2022-06-30 DIAGNOSIS — Z139 Encounter for screening, unspecified: Secondary | ICD-10-CM | POA: Diagnosis not present

## 2022-06-30 DIAGNOSIS — E785 Hyperlipidemia, unspecified: Secondary | ICD-10-CM | POA: Diagnosis not present

## 2022-06-30 DIAGNOSIS — Z0000 Encounter for general adult medical examination without abnormal findings: Secondary | ICD-10-CM | POA: Diagnosis not present

## 2022-06-30 DIAGNOSIS — Z9181 History of falling: Secondary | ICD-10-CM | POA: Diagnosis not present

## 2022-06-30 DIAGNOSIS — Z1331 Encounter for screening for depression: Secondary | ICD-10-CM | POA: Diagnosis not present

## 2022-08-31 DIAGNOSIS — Z23 Encounter for immunization: Secondary | ICD-10-CM | POA: Diagnosis not present

## 2022-11-08 DIAGNOSIS — H25013 Cortical age-related cataract, bilateral: Secondary | ICD-10-CM | POA: Diagnosis not present

## 2022-11-08 DIAGNOSIS — H524 Presbyopia: Secondary | ICD-10-CM | POA: Diagnosis not present

## 2022-11-08 DIAGNOSIS — H2513 Age-related nuclear cataract, bilateral: Secondary | ICD-10-CM | POA: Diagnosis not present

## 2022-11-08 DIAGNOSIS — H02839 Dermatochalasis of unspecified eye, unspecified eyelid: Secondary | ICD-10-CM | POA: Diagnosis not present

## 2022-11-22 DIAGNOSIS — N182 Chronic kidney disease, stage 2 (mild): Secondary | ICD-10-CM | POA: Diagnosis not present

## 2022-11-22 DIAGNOSIS — E1169 Type 2 diabetes mellitus with other specified complication: Secondary | ICD-10-CM | POA: Diagnosis not present

## 2022-11-22 DIAGNOSIS — E785 Hyperlipidemia, unspecified: Secondary | ICD-10-CM | POA: Diagnosis not present

## 2022-11-22 DIAGNOSIS — I1310 Hypertensive heart and chronic kidney disease without heart failure, with stage 1 through stage 4 chronic kidney disease, or unspecified chronic kidney disease: Secondary | ICD-10-CM | POA: Diagnosis not present

## 2022-11-22 DIAGNOSIS — F410 Panic disorder [episodic paroxysmal anxiety] without agoraphobia: Secondary | ICD-10-CM | POA: Diagnosis not present

## 2022-11-22 DIAGNOSIS — R519 Headache, unspecified: Secondary | ICD-10-CM | POA: Diagnosis not present

## 2022-11-22 DIAGNOSIS — K2100 Gastro-esophageal reflux disease with esophagitis, without bleeding: Secondary | ICD-10-CM | POA: Diagnosis not present

## 2022-11-22 DIAGNOSIS — Z1231 Encounter for screening mammogram for malignant neoplasm of breast: Secondary | ICD-10-CM | POA: Diagnosis not present

## 2022-11-22 DIAGNOSIS — E039 Hypothyroidism, unspecified: Secondary | ICD-10-CM | POA: Diagnosis not present

## 2023-03-03 DIAGNOSIS — R310 Gross hematuria: Secondary | ICD-10-CM | POA: Diagnosis not present

## 2023-05-23 DIAGNOSIS — R519 Headache, unspecified: Secondary | ICD-10-CM | POA: Diagnosis not present

## 2023-05-23 DIAGNOSIS — K2100 Gastro-esophageal reflux disease with esophagitis, without bleeding: Secondary | ICD-10-CM | POA: Diagnosis not present

## 2023-05-23 DIAGNOSIS — I1310 Hypertensive heart and chronic kidney disease without heart failure, with stage 1 through stage 4 chronic kidney disease, or unspecified chronic kidney disease: Secondary | ICD-10-CM | POA: Diagnosis not present

## 2023-05-23 DIAGNOSIS — E785 Hyperlipidemia, unspecified: Secondary | ICD-10-CM | POA: Diagnosis not present

## 2023-05-23 DIAGNOSIS — F410 Panic disorder [episodic paroxysmal anxiety] without agoraphobia: Secondary | ICD-10-CM | POA: Diagnosis not present

## 2023-05-23 DIAGNOSIS — M79672 Pain in left foot: Secondary | ICD-10-CM | POA: Diagnosis not present

## 2023-05-23 DIAGNOSIS — E1169 Type 2 diabetes mellitus with other specified complication: Secondary | ICD-10-CM | POA: Diagnosis not present

## 2023-05-23 DIAGNOSIS — N182 Chronic kidney disease, stage 2 (mild): Secondary | ICD-10-CM | POA: Diagnosis not present

## 2023-05-23 DIAGNOSIS — E039 Hypothyroidism, unspecified: Secondary | ICD-10-CM | POA: Diagnosis not present

## 2023-07-11 DIAGNOSIS — Z9181 History of falling: Secondary | ICD-10-CM | POA: Diagnosis not present

## 2023-07-11 DIAGNOSIS — Z0000 Encounter for general adult medical examination without abnormal findings: Secondary | ICD-10-CM | POA: Diagnosis not present

## 2023-08-24 DIAGNOSIS — Z23 Encounter for immunization: Secondary | ICD-10-CM | POA: Diagnosis not present

## 2023-11-28 DIAGNOSIS — E039 Hypothyroidism, unspecified: Secondary | ICD-10-CM | POA: Diagnosis not present

## 2023-11-28 DIAGNOSIS — N182 Chronic kidney disease, stage 2 (mild): Secondary | ICD-10-CM | POA: Diagnosis not present

## 2023-11-28 DIAGNOSIS — E785 Hyperlipidemia, unspecified: Secondary | ICD-10-CM | POA: Diagnosis not present

## 2023-11-28 DIAGNOSIS — Z139 Encounter for screening, unspecified: Secondary | ICD-10-CM | POA: Diagnosis not present

## 2023-11-28 DIAGNOSIS — E1169 Type 2 diabetes mellitus with other specified complication: Secondary | ICD-10-CM | POA: Diagnosis not present

## 2023-11-28 DIAGNOSIS — K2100 Gastro-esophageal reflux disease with esophagitis, without bleeding: Secondary | ICD-10-CM | POA: Diagnosis not present

## 2023-11-28 DIAGNOSIS — I1310 Hypertensive heart and chronic kidney disease without heart failure, with stage 1 through stage 4 chronic kidney disease, or unspecified chronic kidney disease: Secondary | ICD-10-CM | POA: Diagnosis not present

## 2023-11-28 DIAGNOSIS — F410 Panic disorder [episodic paroxysmal anxiety] without agoraphobia: Secondary | ICD-10-CM | POA: Diagnosis not present

## 2023-11-28 DIAGNOSIS — R519 Headache, unspecified: Secondary | ICD-10-CM | POA: Diagnosis not present

## 2024-05-28 DIAGNOSIS — E039 Hypothyroidism, unspecified: Secondary | ICD-10-CM | POA: Diagnosis not present

## 2024-05-28 DIAGNOSIS — M1612 Unilateral primary osteoarthritis, left hip: Secondary | ICD-10-CM | POA: Diagnosis not present

## 2024-05-28 DIAGNOSIS — E1169 Type 2 diabetes mellitus with other specified complication: Secondary | ICD-10-CM | POA: Diagnosis not present

## 2024-05-28 DIAGNOSIS — E785 Hyperlipidemia, unspecified: Secondary | ICD-10-CM | POA: Diagnosis not present

## 2024-05-28 DIAGNOSIS — N182 Chronic kidney disease, stage 2 (mild): Secondary | ICD-10-CM | POA: Diagnosis not present

## 2024-05-28 DIAGNOSIS — K2100 Gastro-esophageal reflux disease with esophagitis, without bleeding: Secondary | ICD-10-CM | POA: Diagnosis not present

## 2024-05-28 DIAGNOSIS — E538 Deficiency of other specified B group vitamins: Secondary | ICD-10-CM | POA: Diagnosis not present

## 2024-05-28 DIAGNOSIS — R519 Headache, unspecified: Secondary | ICD-10-CM | POA: Diagnosis not present

## 2024-05-28 DIAGNOSIS — I1310 Hypertensive heart and chronic kidney disease without heart failure, with stage 1 through stage 4 chronic kidney disease, or unspecified chronic kidney disease: Secondary | ICD-10-CM | POA: Diagnosis not present

## 2024-05-28 DIAGNOSIS — F410 Panic disorder [episodic paroxysmal anxiety] without agoraphobia: Secondary | ICD-10-CM | POA: Diagnosis not present

## 2024-06-25 DIAGNOSIS — D509 Iron deficiency anemia, unspecified: Secondary | ICD-10-CM | POA: Diagnosis not present
